# Patient Record
Sex: Female | Born: 1975 | Race: White | Hispanic: No | Marital: Married | Smoking: Former smoker
Health system: Southern US, Community
[De-identification: ages and names within clinical notes are randomized; demographics above are authoritative.]

## PROBLEM LIST (undated history)

## (undated) HISTORY — PX: SHOULDER SURGERY: SHX246

## (undated) HISTORY — PX: BREAST SURGERY: SHX581

---

## 2000-02-17 ENCOUNTER — Other Ambulatory Visit: Admission: RE | Admit: 2000-02-17 | Discharge: 2000-02-17 | Payer: Self-pay | Admitting: *Deleted

## 2001-02-18 ENCOUNTER — Other Ambulatory Visit: Admission: RE | Admit: 2001-02-18 | Discharge: 2001-02-18 | Payer: Self-pay | Admitting: *Deleted

## 2002-02-19 ENCOUNTER — Other Ambulatory Visit: Admission: RE | Admit: 2002-02-19 | Discharge: 2002-02-19 | Payer: Self-pay | Admitting: *Deleted

## 2003-02-24 ENCOUNTER — Other Ambulatory Visit: Admission: RE | Admit: 2003-02-24 | Discharge: 2003-02-24 | Payer: Self-pay | Admitting: *Deleted

## 2004-02-25 ENCOUNTER — Other Ambulatory Visit: Admission: RE | Admit: 2004-02-25 | Discharge: 2004-02-25 | Payer: Self-pay | Admitting: *Deleted

## 2005-02-28 ENCOUNTER — Other Ambulatory Visit: Admission: RE | Admit: 2005-02-28 | Discharge: 2005-02-28 | Payer: Self-pay | Admitting: *Deleted

## 2007-01-03 ENCOUNTER — Ambulatory Visit (HOSPITAL_COMMUNITY): Admission: RE | Admit: 2007-01-03 | Discharge: 2007-01-03 | Payer: Self-pay | Admitting: Obstetrics and Gynecology

## 2007-01-04 ENCOUNTER — Inpatient Hospital Stay (HOSPITAL_COMMUNITY): Admission: AD | Admit: 2007-01-04 | Discharge: 2007-01-07 | Payer: Self-pay | Admitting: Obstetrics and Gynecology

## 2007-01-05 ENCOUNTER — Encounter (INDEPENDENT_AMBULATORY_CARE_PROVIDER_SITE_OTHER): Payer: Self-pay | Admitting: Specialist

## 2007-05-14 ENCOUNTER — Other Ambulatory Visit: Admission: RE | Admit: 2007-05-14 | Discharge: 2007-05-14 | Payer: Self-pay | Admitting: Family Medicine

## 2007-12-05 ENCOUNTER — Other Ambulatory Visit: Admission: RE | Admit: 2007-12-05 | Discharge: 2007-12-05 | Payer: Self-pay | Admitting: Family Medicine

## 2008-04-23 IMAGING — US US FETAL BPP W/O NONSTRESS
1 series · 14 of 28 positions shown · non-contrast
Comparison: none

OBSTETRICAL ULTRASOUND:

 This ultrasound exam was performed in the [HOSPITAL] Ultrasound Department.  The OB US report was generated in the AS system, and faxed to the ordering physician.  This report is also available in [REDACTED] PACS.

[Series 1: us fetal bpp w/o nonstress · 0.37mm/px · 14 of 29 slices shown]
[im 2/29]
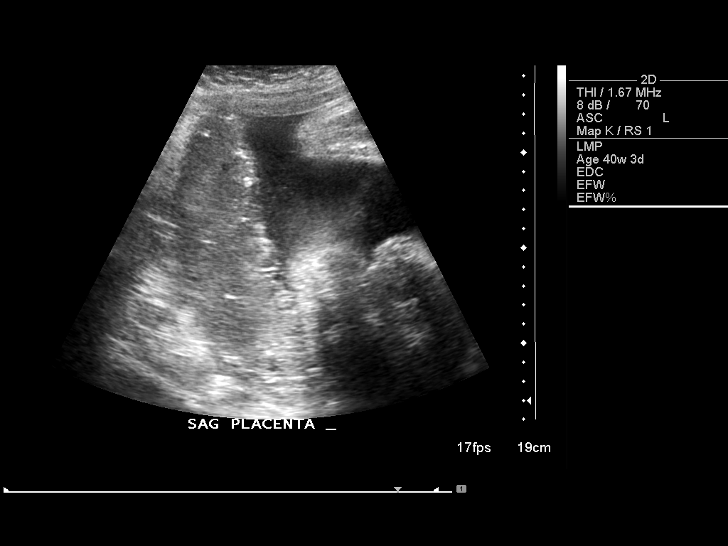
[im 4/29]
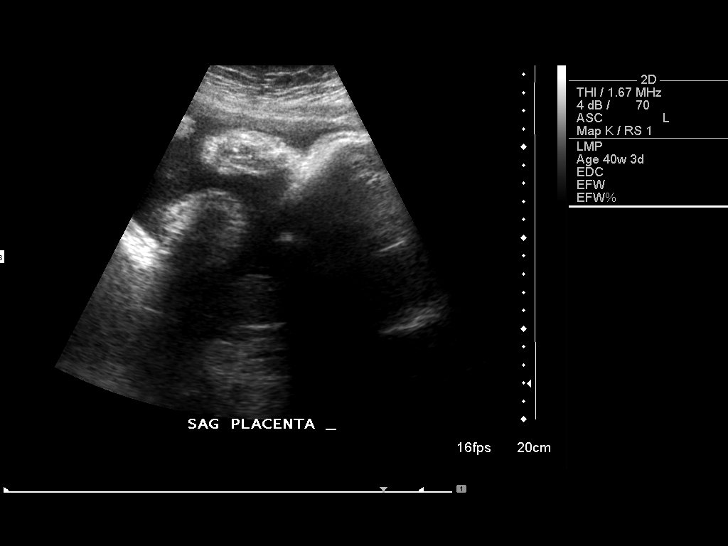
[im 6/29]
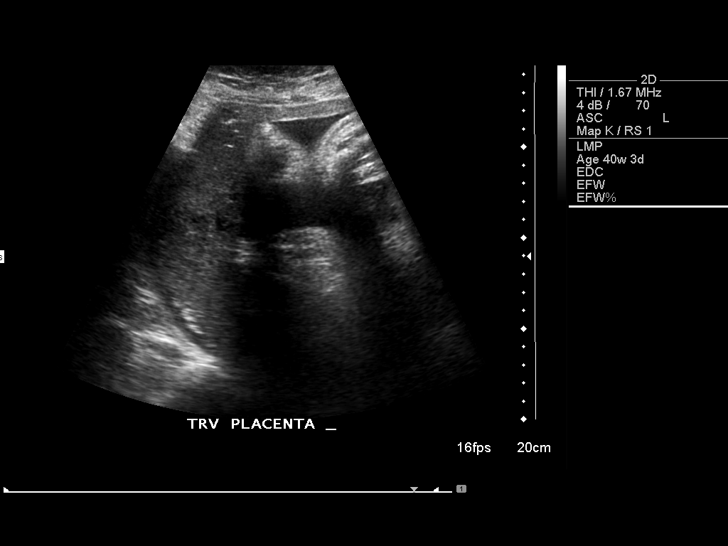
[im 8/29]
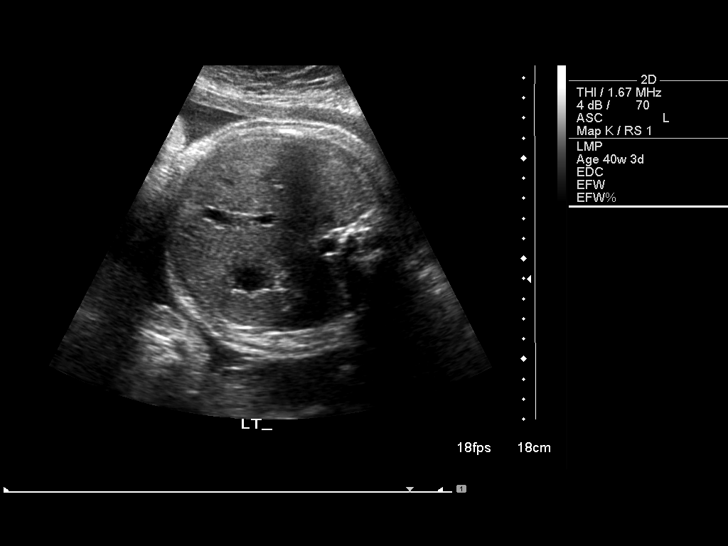
[im 10/29]
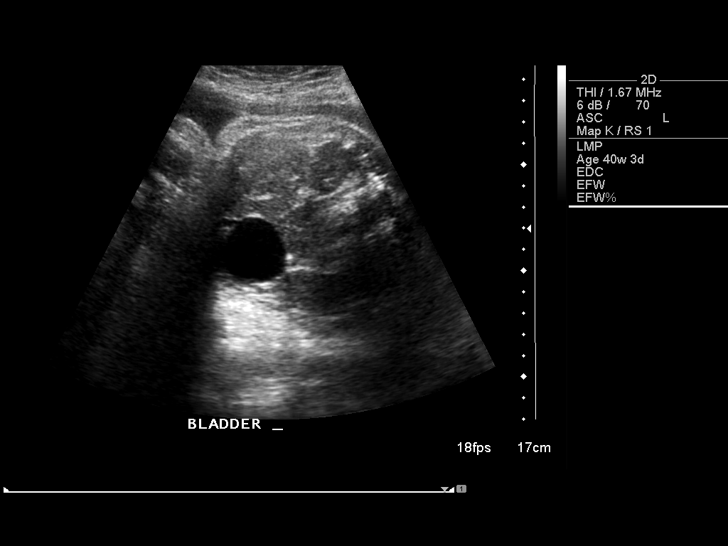
[im 12/29]
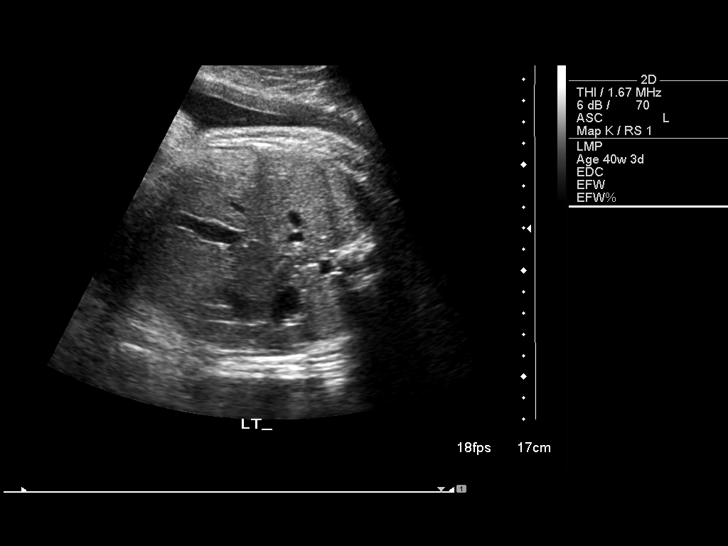
[im 14/29]
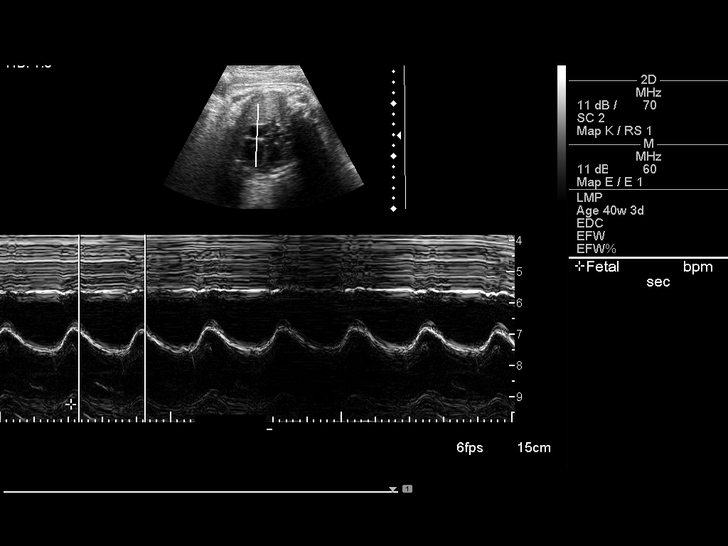
[im 16/29]
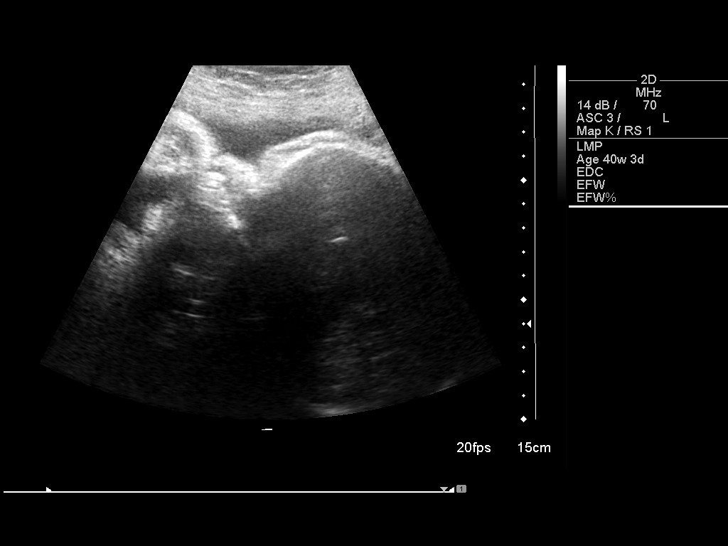
[im 18/29]
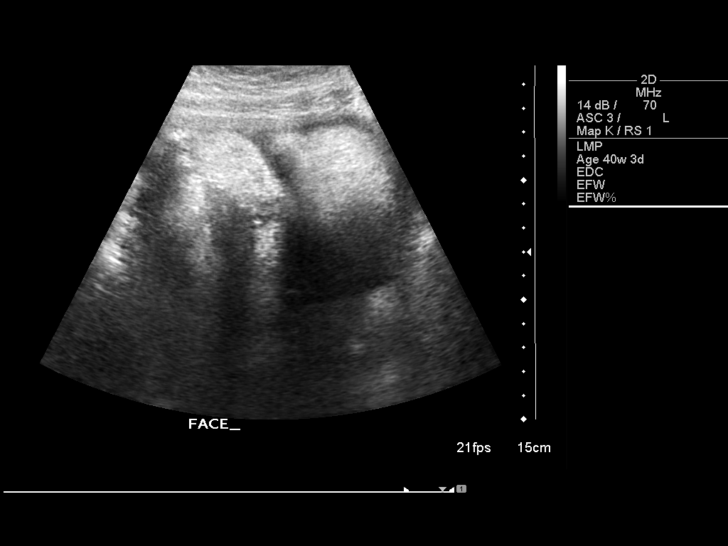
[im 20/29]
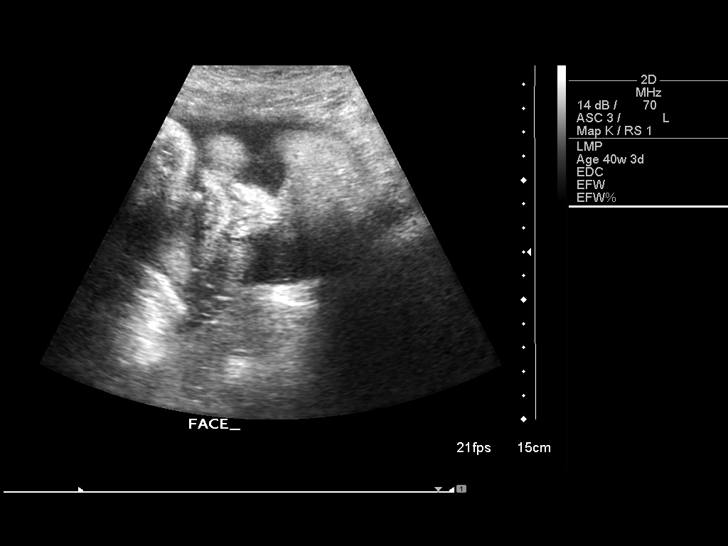
[im 22/29]
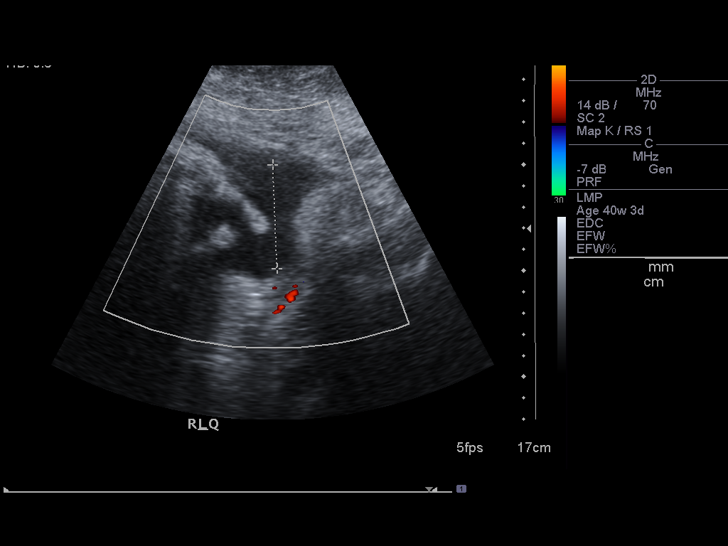
[im 24/29]
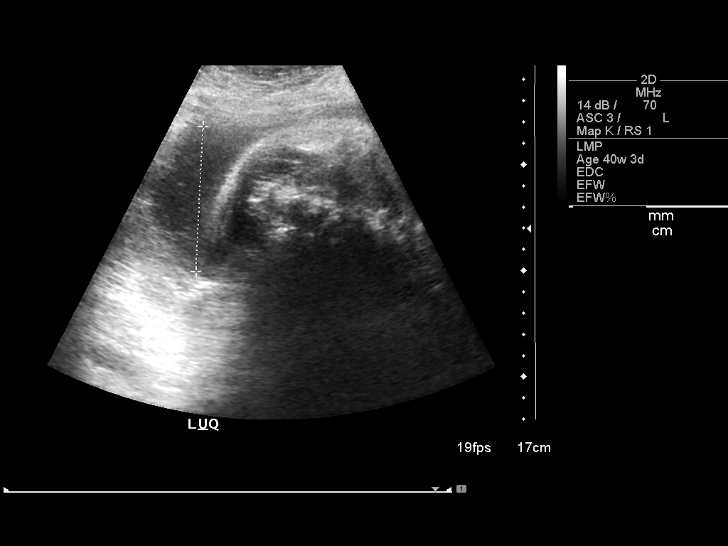
[im 26/29]
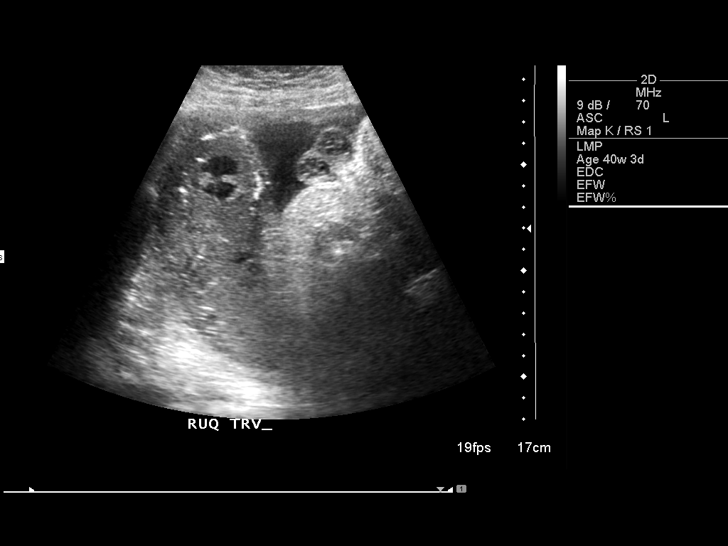
[im 29/29]
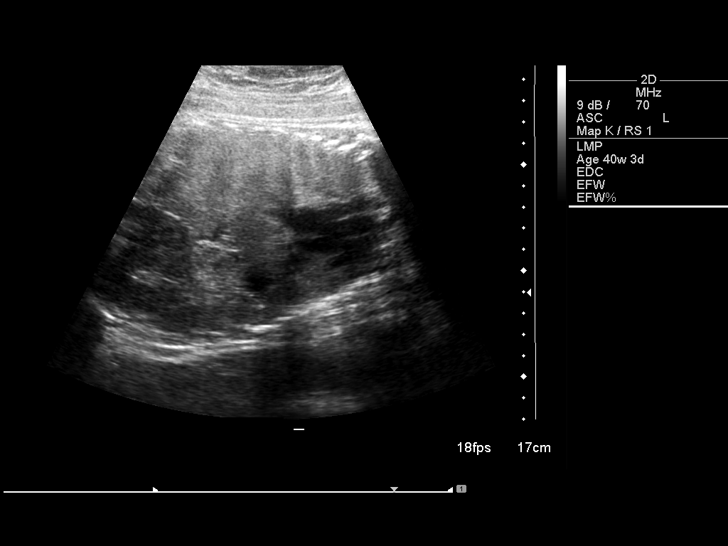

[14 of 28 positions shown; findings below may reference images not displayed]

IMPRESSION: See AS Obstetric US report.

## 2008-04-25 IMAGING — CR DG PORTABLE PELVIS
1 series · 1 of 1 positions shown · non-contrast
Comparison: No prior studies for comparison.

CLINICAL DATA: Status post vaginal delivery with repair.  Lost needle. 
 PORTABLE PELVIS:

[view not recorded]
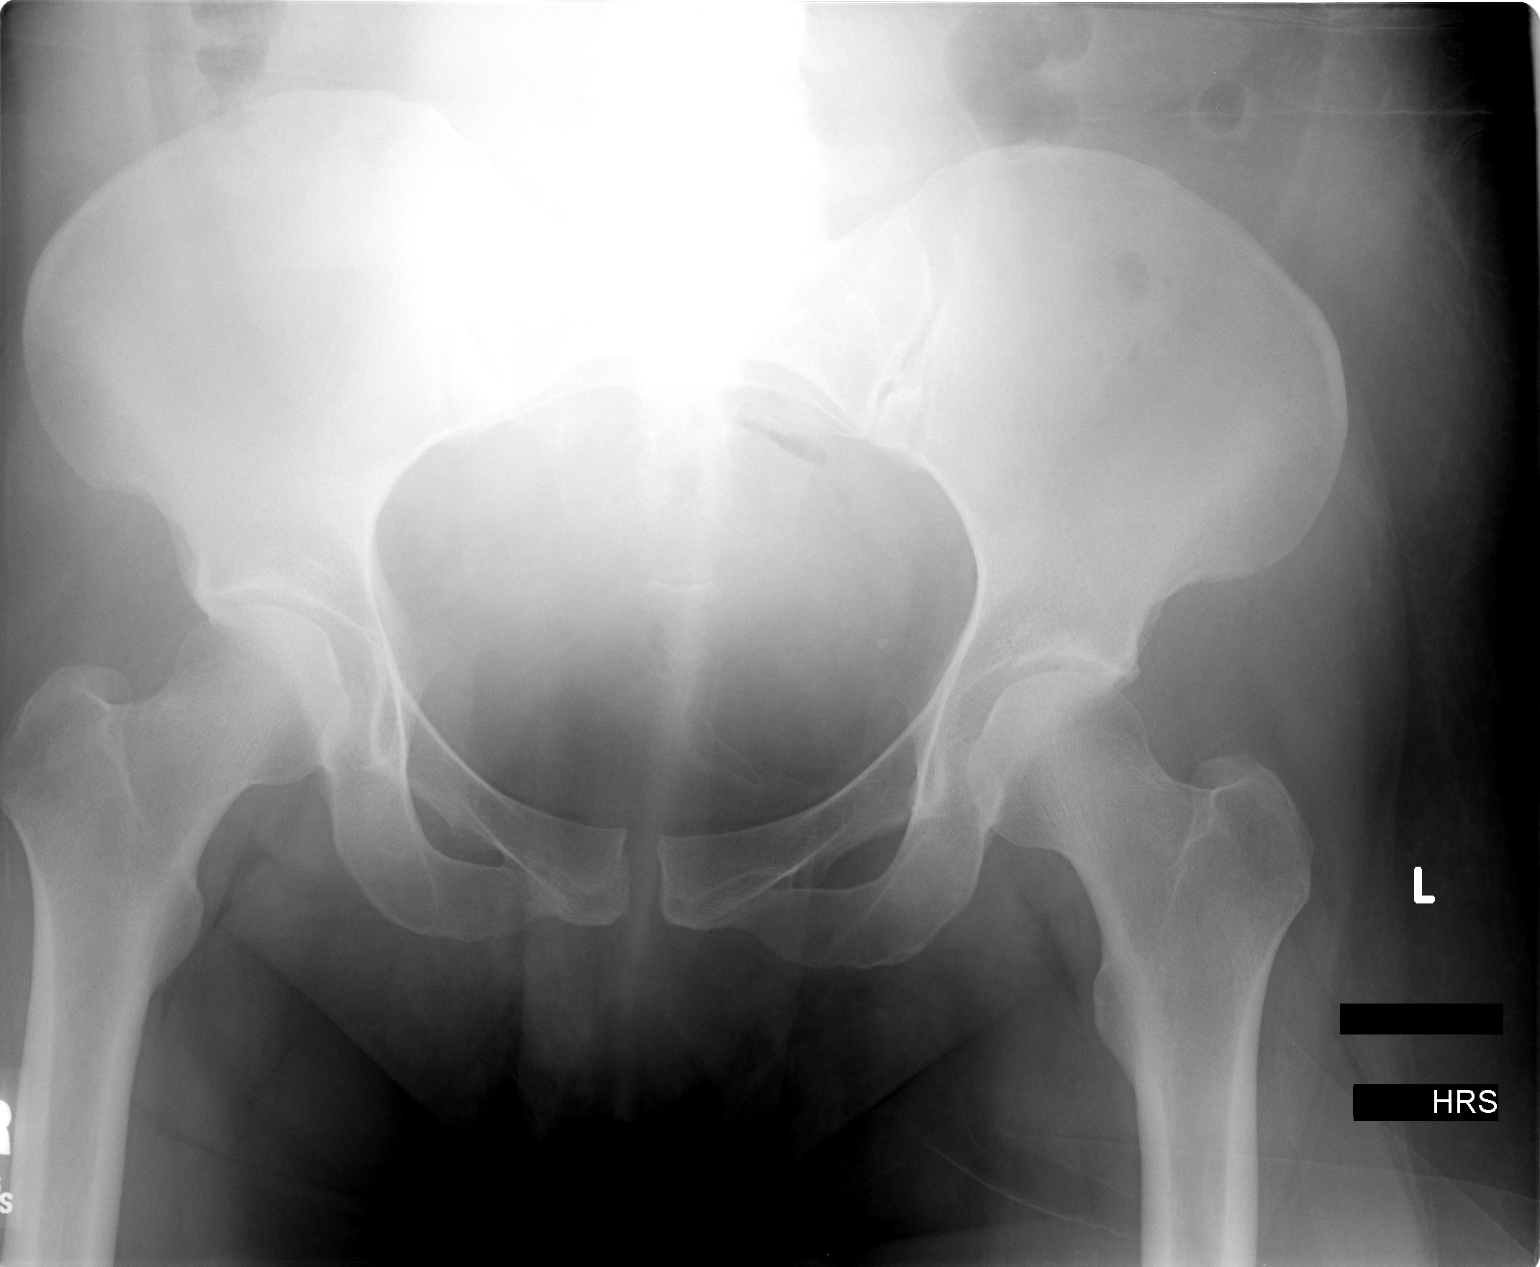

[1 of 1 positions shown; findings below may reference images not displayed]

FINDINGS: No visible foreign body. The bony pelvis is unremarkable.
IMPRESSION: No foreign body is seen.

## 2008-07-15 ENCOUNTER — Other Ambulatory Visit: Admission: RE | Admit: 2008-07-15 | Discharge: 2008-07-15 | Payer: Self-pay | Admitting: Family Medicine

## 2009-01-05 ENCOUNTER — Other Ambulatory Visit: Admission: RE | Admit: 2009-01-05 | Discharge: 2009-01-05 | Payer: Self-pay | Admitting: Family Medicine

## 2009-06-08 ENCOUNTER — Ambulatory Visit (HOSPITAL_COMMUNITY): Admission: RE | Admit: 2009-06-08 | Discharge: 2009-06-08 | Payer: Self-pay | Admitting: Obstetrics and Gynecology

## 2011-06-15 ENCOUNTER — Other Ambulatory Visit: Payer: Self-pay | Admitting: Plastic Surgery

## 2012-10-10 ENCOUNTER — Other Ambulatory Visit: Payer: Self-pay | Admitting: Obstetrics and Gynecology

## 2014-06-18 ENCOUNTER — Other Ambulatory Visit: Payer: Self-pay | Admitting: Obstetrics and Gynecology

## 2014-06-22 LAB — CYTOLOGY - PAP

## 2014-07-02 ENCOUNTER — Other Ambulatory Visit: Payer: Self-pay | Admitting: Obstetrics and Gynecology

## 2014-07-02 DIAGNOSIS — R928 Other abnormal and inconclusive findings on diagnostic imaging of breast: Secondary | ICD-10-CM

## 2014-07-10 ENCOUNTER — Ambulatory Visit
Admission: RE | Admit: 2014-07-10 | Discharge: 2014-07-10 | Disposition: A | Discharge status: Home / Self Care | Payer: 59 | Source: Ambulatory Visit | Attending: Obstetrics and Gynecology | Admitting: Obstetrics and Gynecology

## 2014-07-10 ENCOUNTER — Encounter (INDEPENDENT_AMBULATORY_CARE_PROVIDER_SITE_OTHER): Payer: Self-pay

## 2014-07-10 DIAGNOSIS — R928 Other abnormal and inconclusive findings on diagnostic imaging of breast: Secondary | ICD-10-CM

## 2015-07-02 ENCOUNTER — Other Ambulatory Visit: Payer: Self-pay | Admitting: Obstetrics and Gynecology

## 2015-07-05 LAB — CYTOLOGY - PAP

## 2016-03-14 ENCOUNTER — Other Ambulatory Visit: Payer: Self-pay | Admitting: Obstetrics and Gynecology

## 2016-03-14 DIAGNOSIS — N6489 Other specified disorders of breast: Secondary | ICD-10-CM

## 2016-03-17 ENCOUNTER — Ambulatory Visit
Admission: RE | Admit: 2016-03-17 | Discharge: 2016-03-17 | Disposition: A | Discharge status: Home / Self Care | Payer: Self-pay | Source: Ambulatory Visit | Attending: Obstetrics and Gynecology | Admitting: Obstetrics and Gynecology

## 2016-03-17 ENCOUNTER — Other Ambulatory Visit: Payer: Self-pay | Admitting: Obstetrics and Gynecology

## 2016-03-17 DIAGNOSIS — N6489 Other specified disorders of breast: Secondary | ICD-10-CM

## 2017-04-23 DIAGNOSIS — Z0000 Encounter for general adult medical examination without abnormal findings: Secondary | ICD-10-CM | POA: Diagnosis not present

## 2017-07-10 DIAGNOSIS — S63613A Unspecified sprain of left middle finger, initial encounter: Secondary | ICD-10-CM | POA: Diagnosis not present

## 2017-07-10 DIAGNOSIS — M79645 Pain in left finger(s): Secondary | ICD-10-CM | POA: Diagnosis not present

## 2017-08-17 DIAGNOSIS — J209 Acute bronchitis, unspecified: Secondary | ICD-10-CM | POA: Diagnosis not present

## 2017-10-15 DIAGNOSIS — Z01419 Encounter for gynecological examination (general) (routine) without abnormal findings: Secondary | ICD-10-CM | POA: Diagnosis not present

## 2017-10-15 DIAGNOSIS — Z6838 Body mass index (BMI) 38.0-38.9, adult: Secondary | ICD-10-CM | POA: Diagnosis not present

## 2017-10-17 IMAGING — MG 2D DIGITAL DIAGNOSTIC BILATERAL MAMMOGRAM WITH CAD AND ADJUNCT T
8 of 12 series · 8 of 28 positions shown · non-contrast
Comparison: Prior exam dated 06/30/2014 and 07/10/2014.

CLINICAL DATA: 39-year-old female status post reduction mammoplasty
in 6856. Follow-up of probable benign findings in the right breast.

EXAM:
2D DIGITAL DIAGNOSTIC BILATERAL MAMMOGRAM WITH CAD AND ADJUNCT TOMO
ULTRASOUND LEFT BREAST

[L CC synth-2D]
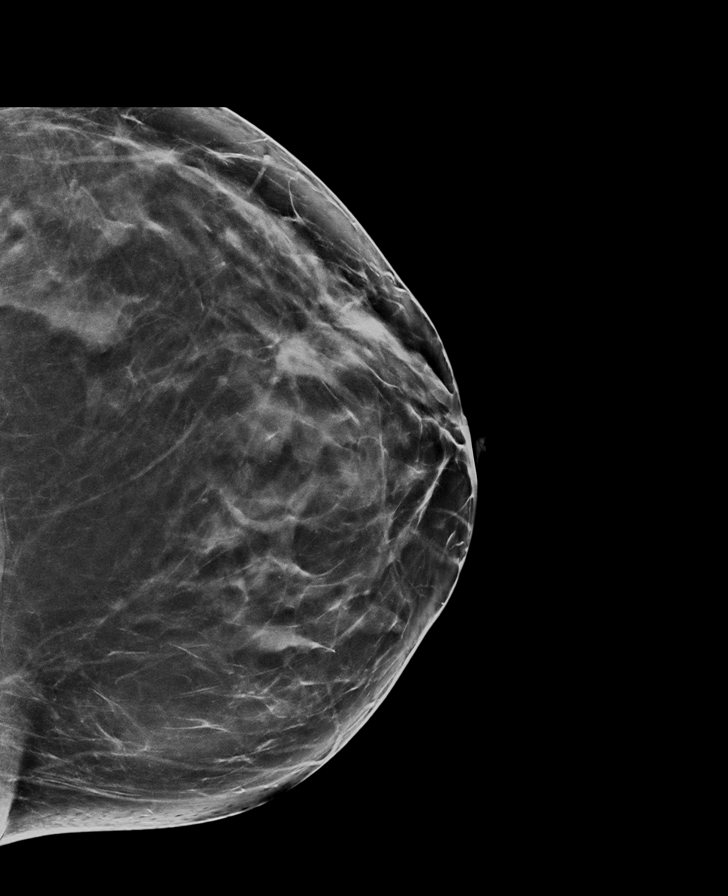

[L MLO synth-2D]
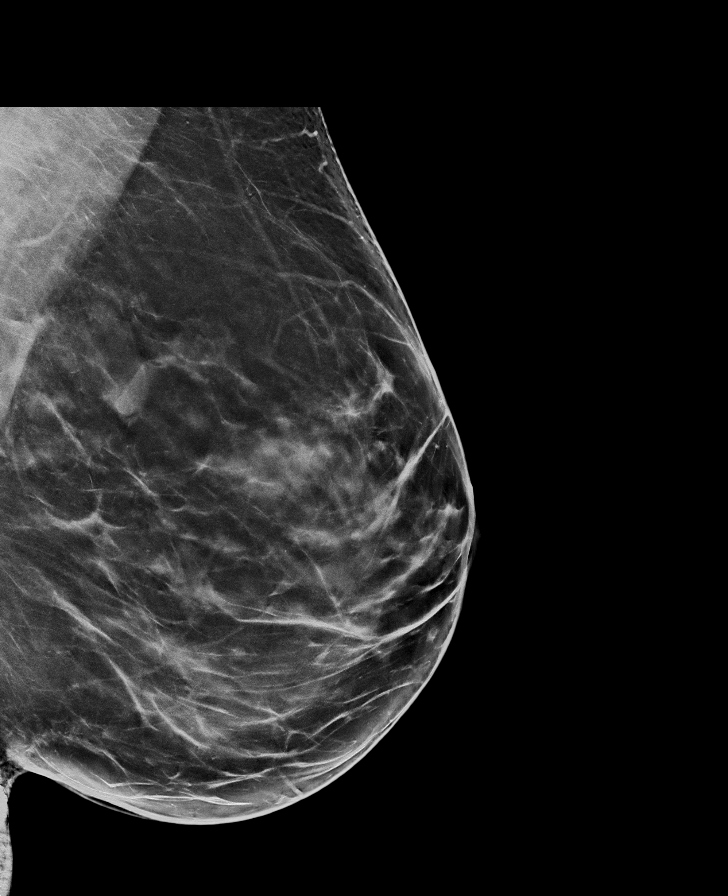

[R MLO synth-2D]
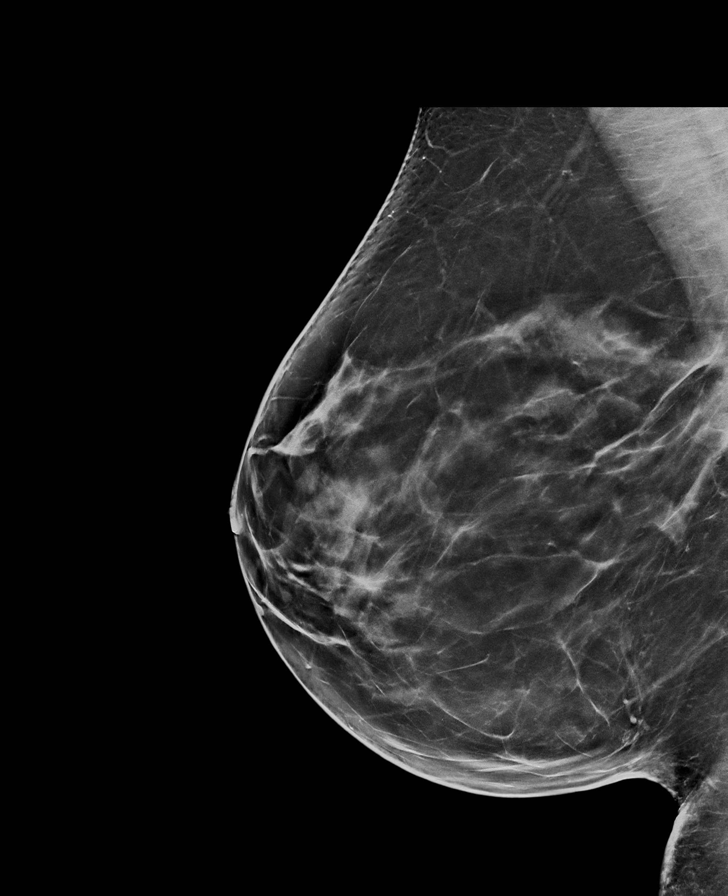

[R MLO]
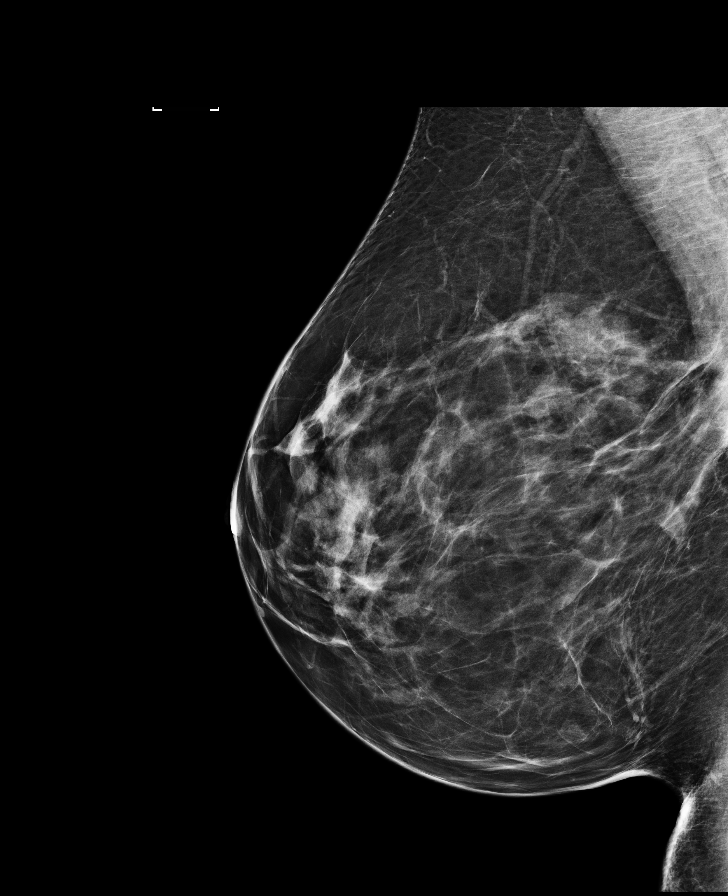

[L CC]
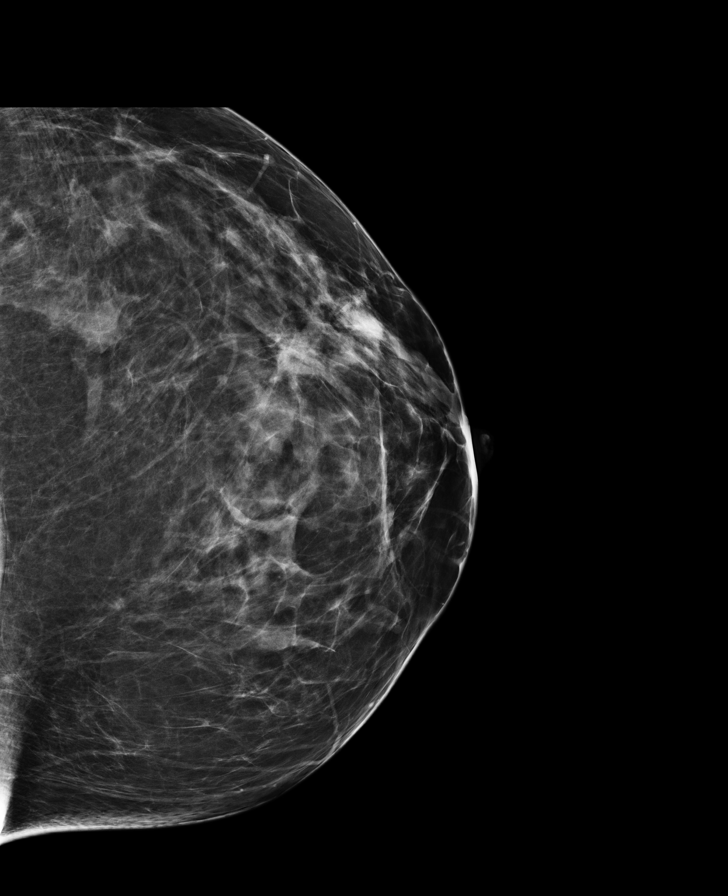

[R CC]
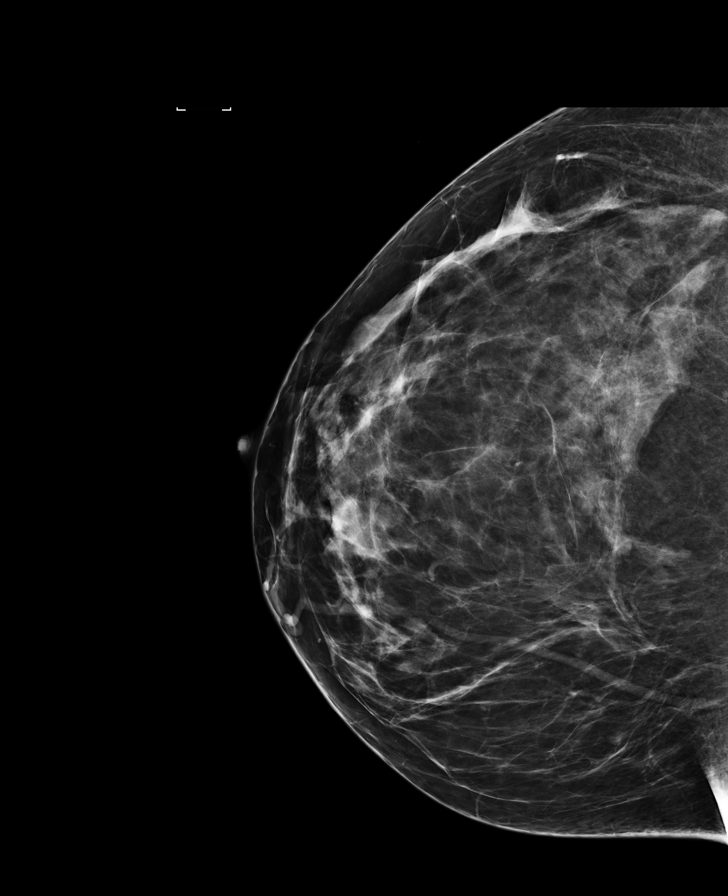

[L MLO]
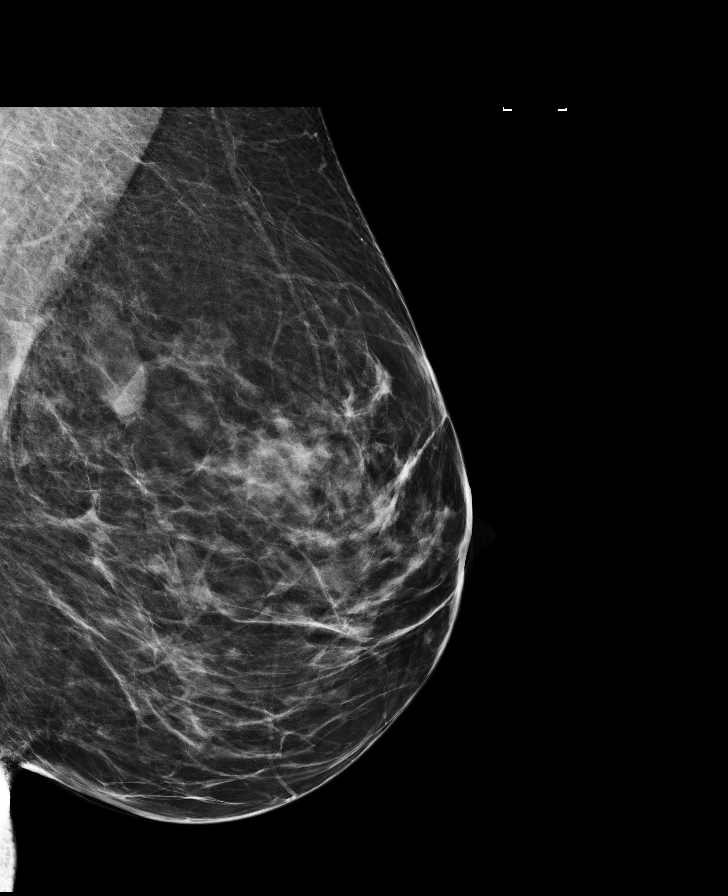

[R CC synth-2D]
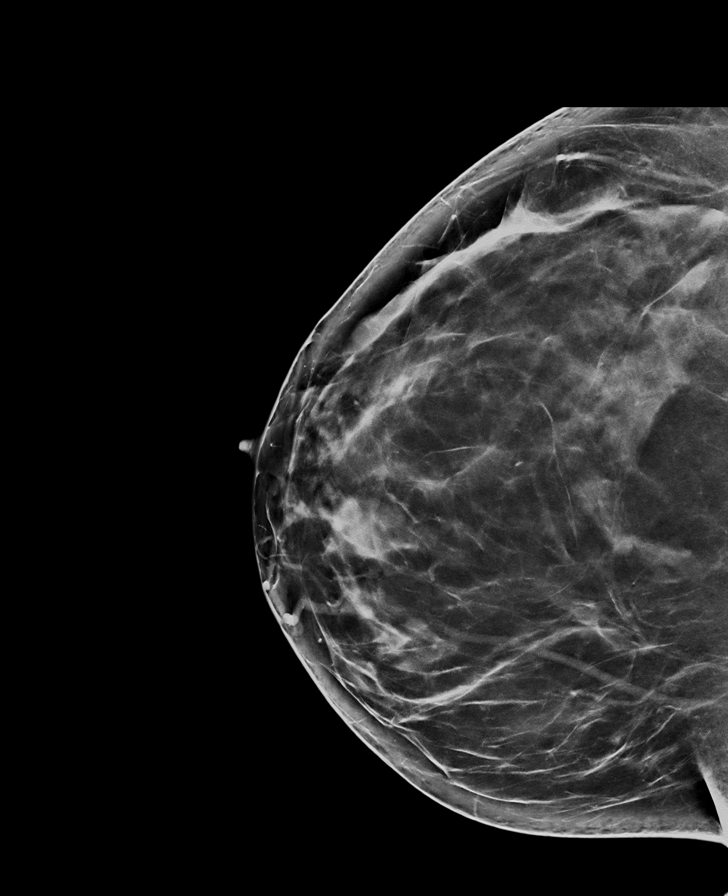

[8 of 28 positions shown; findings below may reference images not displayed]

ACR Breast Density Category b: There are scattered areas of
fibroglandular density.
FINDINGS: Changes of reduction mammoplasty are seen, bilaterally. Parenchymal
pattern of the right breast is stable. No suspicious mass or
malignant type microcalcifications identified in the right breast.
In the upper-outer quadrant of the left breast is an ovoid partially
obscured 1.2 cm mass. There are no malignant type
microcalcifications.

Mammographic images were processed with CAD.

On physical exam, I do not palpate a discrete mass in the
upper-outer quadrant of the left breast.

Targeted ultrasound is performed, showing a well-circumscribed
anechoic cyst in the left breast at 2 o'clock 6 cm from the nipple
measuring 1.2 x 0.6 x 1.5 cm. No solid mass or abnormal shadowing
detected.
IMPRESSION: Left breast cyst.  No evidence of malignancy in either breast.

RECOMMENDATION:
If the clinical exam remains benign/ stable screening mammography
can be deferred until the age of 42.

I have discussed the findings and recommendations with the patient.
Results were also provided in writing at the conclusion of the
visit. If applicable, a reminder letter will be sent to the patient
regarding the next appointment.

BI-RADS CATEGORY  2: Benign.

## 2017-11-05 DIAGNOSIS — Z1231 Encounter for screening mammogram for malignant neoplasm of breast: Secondary | ICD-10-CM | POA: Diagnosis not present

## 2017-12-13 DIAGNOSIS — R6883 Chills (without fever): Secondary | ICD-10-CM | POA: Diagnosis not present

## 2017-12-13 DIAGNOSIS — J029 Acute pharyngitis, unspecified: Secondary | ICD-10-CM | POA: Diagnosis not present

## 2018-05-01 DIAGNOSIS — Z0000 Encounter for general adult medical examination without abnormal findings: Secondary | ICD-10-CM | POA: Diagnosis not present

## 2018-05-02 DIAGNOSIS — S90121A Contusion of right lesser toe(s) without damage to nail, initial encounter: Secondary | ICD-10-CM | POA: Diagnosis not present

## 2018-05-02 DIAGNOSIS — M79671 Pain in right foot: Secondary | ICD-10-CM | POA: Diagnosis not present

## 2018-09-06 DIAGNOSIS — J069 Acute upper respiratory infection, unspecified: Secondary | ICD-10-CM | POA: Diagnosis not present

## 2018-09-06 DIAGNOSIS — L509 Urticaria, unspecified: Secondary | ICD-10-CM | POA: Diagnosis not present

## 2018-11-06 DIAGNOSIS — Z01419 Encounter for gynecological examination (general) (routine) without abnormal findings: Secondary | ICD-10-CM | POA: Diagnosis not present

## 2018-11-06 DIAGNOSIS — Z6838 Body mass index (BMI) 38.0-38.9, adult: Secondary | ICD-10-CM | POA: Diagnosis not present

## 2019-12-26 ENCOUNTER — Ambulatory Visit: Payer: 59 | Attending: Internal Medicine

## 2019-12-26 DIAGNOSIS — Z23 Encounter for immunization: Secondary | ICD-10-CM

## 2019-12-26 NOTE — Progress Notes (Signed)
   Covid-19 Vaccination Clinic  Name:  Christie Patel    MRN: [id number] DOB: 05/31/1976  12/26/2019  [name] was observed post Covid-19 immunization for 15 minutes without incident. She was provided with Vaccine Information Sheet and instruction to access the V-Safe system.   [name] was instructed to call 911 with any severe reactions post vaccine: Marland Kitchen Difficulty breathing  . Swelling of face and throat  . A fast heartbeat  . A bad rash all over body  . Dizziness and weakness   Immunizations Administered    Name Date Dose VIS Date Route   Pfizer COVID-19 Vaccine 12/26/2019 11:58 AM 0.3 mL 09/12/2019 Intramuscular   Manufacturer: [employer], [employer]   Lot: [id number]   NDC: [address]-6

## 2020-01-19 ENCOUNTER — Ambulatory Visit: Payer: 59 | Attending: Internal Medicine

## 2020-01-19 DIAGNOSIS — Z23 Encounter for immunization: Secondary | ICD-10-CM

## 2020-01-19 NOTE — Progress Notes (Signed)
   Covid-19 Vaccination Clinic  Name:  Christie Patel    MRN: [id number] DOB: [birth year]  01/19/2020  [name] was observed post Covid-19 immunization for 15 minutes without incident. She was provided with Vaccine Information Sheet and instruction to access the V-Safe system.   [name] was instructed to call 911 with any severe reactions post vaccine: Marland Kitchen Difficulty breathing  . Swelling of face and throat  . A fast heartbeat  . A bad rash all over body  . Dizziness and weakness   Immunizations Administered    Name Date Dose VIS Date Route   Pfizer COVID-19 Vaccine 01/19/2020  4:24 PM 0.3 mL 11/26/2018 Intramuscular   Manufacturer: [employer], [employer]   Lot: [id number]   NDC: [address]-5

## 2022-05-30 NOTE — Therapy (Deleted)
  OUTPATIENT PHYSICAL THERAPY TREATMENT NOTE   Patient Name: Christie Patel MRN: [id number] DOB:1976/08/30, 46 y.o., female Today's Date: 05/30/2022  PCP: None REFERRING PROVIDER: Jarrett Soho, PA-C  END OF SESSION:    No past medical history on file. *** The histories are not reviewed yet. Please review them in the "History" navigator section and refresh this SmartLink. There are no problems to display for this patient.   REFERRING DIAG: N39.3 (ICD-10-CM) - Stress incontinence (female) (female)  THERAPY DIAG:  No diagnosis found.  Rationale for Evaluation and Treatment Rehabilitation  PERTINENT HISTORY: ***  PRECAUTIONS: ***  SUBJECTIVE: ***  PAIN:  Are you having pain? {OPRCPAIN:27236}   OBJECTIVE: (objective measures completed at initial evaluation unless otherwise dated)   (Copy Eval's Objective through Plan section here)   Kenson Groh, PT 05/30/2022, 8:41 AM

## 2022-05-30 NOTE — Therapy (Unsigned)
OUTPATIENT PHYSICAL THERAPY FEMALE PELVIC EVALUATION   Patient Name: Christie Patel MRN: [id number] DOB:05/17/1976, 46 y.o., female Today's Date: 05/31/2022   PT End of Session - 05/31/22 1052     Visit Number 1    Date for PT Re-Evaluation 08/23/22    Authorization Type UHC    PT Start Time 1055    PT Stop Time 1135    PT Time Calculation (min) 40 min    Activity Tolerance Patient tolerated treatment well    Behavior During Therapy [facility] for tasks assessed/performed             History reviewed. No pertinent past medical history. Past Surgical History:  Procedure Laterality Date   BREAST SURGERY     SHOULDER SURGERY Bilateral    rotator cuff and biceps   There are no problems to display for this patient.   PCP: None  REFERRING PROVIDER: Jarrett Soho, PA-C  REFERRING DIAG: N39.3 (ICD-10-CM) - Stress incontinence (female) (female)  THERAPY DIAG:  No diagnosis found.  Rationale for Evaluation and Treatment Rehabilitation  ONSET DATE: 2008  SUBJECTIVE:                                                                                                                                                                                           SUBJECTIVE STATEMENT: Patient started with stress incontinence after having child in 2008. Phobia with public bathroom.  Fluid intake: Yes: water     PAIN:  Are you having pain? yes NPRS scale: 4/10 Pain location: Vaginal  Pain type: discomfort Pain description: intermittent   Aggravating factors: during intercourse Relieving factors: no vaginal penetration  PRECAUTIONS: None  WEIGHT BEARING RESTRICTIONS No  FALLS:  Has patient fallen in last 6 months? No  LIVING ENVIRONMENT: Lives with: lives with their family  OCCUPATION: sitting job; walks 2 miles per day, yoga, [employer]  PLOF: Independent  PATIENT GOALS reduce leakage  PERTINENT HISTORY:  none  BOWEL MOVEMENT Pain with bowel movement:  No  URINATION Pain with urination: No Fully empty bladder: Yes: can urinate in one hour without having liquid prior Stream:  average Urgency: Yes: sometimes Frequency: in the morning will go more often but later in the day can wait longer.  Leakage: Urge to void, Walking to the bathroom, Coughing, Sneezing, and Exercise Pads: No  INTERCOURSE Pain with intercourse: During Penetration Ability to have vaginal penetration:  Yes:   Climax: yes Marinoff Scale: 1/3  PREGNANCY Vaginal deliveries 1 Tearing Yes: tear  PROLAPSE None    OBJECTIVE:   COGNITION:  Overall cognitive status: Within functional limits for tasks assessed  SENSATION:  Light touch: Appears intact  Proprioception: Appears intact                 POSTURE: No Significant postural limitations   PELVIC ALIGNMENT:  LUMBARAROM/PROM lumbar ROM is full   LOWER EXTREMITY ROM: bilateral hip ROM is full   LOWER EXTREMITY MMT:  MMT Right eval Left eval  Hip abduction 4/5 4/5    PALPATION:   General  contract abdomen and will slightly bulge the lower abdomen.                 External Perineal Exam intact                             Internal Pelvic Floor sides of introitus is tight. After manual work to the sides of the introitus the patient was able to do a circular contraction with a hug of the therapist finger holding 3-4 seconds. She has to contract slowly to form the lift of the pelvic floor.   Patient confirms identification and approves PT to assess internal pelvic floor and treatment Yes  PELVIC MMT:   MMT eval  Vaginal 2/5 with oval contraction  (Blank rows = not tested)        TONE: good  PROLAPSE: None  TODAY'S TREATMENT  EVAL Date: 05/31/2022 HEP established-see below    PATIENT EDUCATION:  05/31/2022 Education details: Access Code: [email or web address] Person educated: Patient Education method: Explanation, Demonstration, Tactile cues, Verbal cues, and Handouts Education  comprehension: verbalized understanding, returned demonstration, verbal cues required, tactile cues required, and needs further education   HOME EXERCISE PROGRAM: 05/31/2022 Access Code: [email or web address] URL: [email or web address] Date: 05/31/2022 Prepared by: Eulis Foster  Exercises - Seated Pelvic Floor Contraction  - 4 x daily - 7 x weekly - 1 sets - 5 reps - 4-5 sec hold  ASSESSMENT:  CLINICAL IMPRESSION: Patient is a 46 y.o. female who was seen today for physical therapy evaluation and treatment for stress incontinence. Patient reports her incontinence started 15 years ago when she had her son vaginally. She will leak urine with urge to void, walking to the bathroom, coughing, sneezing, and exercise.Patient will urinate more often  in the morning  but later in the day can wait longer.Pelvic floor strength was 2/5 in oval shape. The therapist performed manual work to the sides of the introitus and she was able to perform a circular hug with lift and strength went to 3/5. She is only able to hold for 3-4 seconds and needs to contract slowly to have the circular hug. Patient will bear down slightly with a cough or laugh. She does not fully recruit her lower abdominals with contraction. Patient has some pain with vaginal penetration but less with lubricant. There were no tenderness in the pelvic floor muscles. Patient will benefit from skilled therapy to improve pelvic floor strengthening and coordination to reduce her leakage.    OBJECTIVE IMPAIRMENTS decreased coordination, decreased endurance, decreased strength, and increased fascial restrictions.   ACTIVITY LIMITATIONS continence  PARTICIPATION LIMITATIONS:  exercise  PERSONAL FACTORS  none  are also affecting patient's functional outcome.   REHAB POTENTIAL: Excellent  CLINICAL DECISION MAKING: Stable/uncomplicated  EVALUATION COMPLEXITY: Low   GOALS: Goals reviewed with patient? Yes  SHORT TERM GOALS: Target date:  06/28/2022  Patient independent with initial HEP for pelvic floor strengthening to reduce leakage.  Baseline: Goal status: INITIAL   LONG TERM GOALS: Target date: 08/23/2022   Patient  independent with advanced HEP for core and pelvic floor strength to reduce leakage.  Baseline:  Goal status: INITIAL  2.  Patient is able to recruit her lower abdominals with exercise to reduce the pressure on the pelvic floor causing leakage.  Baseline:  Goal status: INITIAL  3.  Patient is able to cough, laugh, and sneeze without leakage due to the ability to contract and lift the pelvic floor.  Baseline:  Goal status: INITIAL  4.  Patient is able to walk to the bathroom without urinary leakage due to improved coordination.  Baseline:  Goal status: INITIAL   PLAN: PT FREQUENCY: 1x/week  PT DURATION: 12 weeks  PLANNED INTERVENTIONS: Therapeutic exercises, Therapeutic activity, Neuromuscular re-education, Patient/Family education, Self Care, Joint mobilization, Biofeedback, and Manual therapy  PLAN FOR NEXT SESSION: work on abdominal recruitment, pelvic floor strength, quick flicks, lubricant education   Eulis Foster, PT 05/31/22 11:59 AM

## 2022-05-31 ENCOUNTER — Encounter: Payer: Self-pay | Admitting: Physical Therapy

## 2022-05-31 ENCOUNTER — Ambulatory Visit: Payer: 59 | Attending: Family Medicine | Admitting: Physical Therapy

## 2022-05-31 ENCOUNTER — Other Ambulatory Visit: Payer: Self-pay

## 2022-05-31 DIAGNOSIS — R278 Other lack of coordination: Secondary | ICD-10-CM | POA: Diagnosis present

## 2022-05-31 DIAGNOSIS — M6281 Muscle weakness (generalized): Secondary | ICD-10-CM | POA: Diagnosis present

## 2022-06-21 ENCOUNTER — Encounter: Payer: Self-pay | Admitting: Physical Therapy

## 2022-06-21 ENCOUNTER — Ambulatory Visit: Payer: 59 | Attending: Family Medicine | Admitting: Physical Therapy

## 2022-06-21 DIAGNOSIS — R278 Other lack of coordination: Secondary | ICD-10-CM

## 2022-06-21 DIAGNOSIS — M6281 Muscle weakness (generalized): Secondary | ICD-10-CM | POA: Diagnosis present

## 2022-06-21 NOTE — Patient Instructions (Signed)
  Lubrication Used for intercourse to reduce friction Avoid ones that have glycerin, nonoxynol-9, petroleum, propylene glycol, chlorhexidine gluconate, warming gels, tingling gels, icing or cooling gel, scented Avoid parabens due to a preservative similar to female sex hormone May need to be reapplied once or several times during sexual activity Can be applied to both partners genitals prior to vaginal penetration to minimize friction or irritation Prevent irritation and mucosal tears that cause post coital pain and increased the risk of vaginal and urinary tract infections Oil-based lubricants cannot be used with condoms due to breaking them down.  Least likely to irritate vaginal tissue.  Plant based-lubes are safe Silicone-based lubrication are thicker and last long and used for post-menopausal women Water based lubricant good for toys  Vaginal Lubricators Here is a list of some suggested lubricators you can use for intercourse. Use the most hypoallergenic product.  You can place on you or your partner.  Slippery Stuff ( water based) Sylk or Sliquid Natural H2O ( good  if frequent UTI's)- [employer], amazon Sliquid organics silk-(aloe and silicone based ) [employer] (www.blossom-organics.com)- (aloe based ) Coconut oil, olive oil -not good with condoms  PJur Woman Nude- (water based) amazon Uberlube- ( silicon) [place] has an organic one Yes lubricant- (water based and has plant oil based similar to silicone) [employer] Platinum-Silicone, [employer], [facility] Olive and Bee intimate cream-  www.oliveandbee.com.au Pink - [employer] Erosense Sync- walmart, amazon Coconu- coconu.com [place] [employer] to avoid in lubricants are glycerin, warming gels, tingling gels, icing or cooling  gels, and scented gels.  Also avoid Vaseline. KY jelly,  and Astroglide contain chlorhexidine which kills good bacteria(lactobacilli)  Things to avoid in the vaginal area Do  not use things to irritate the vulvar area No lotions- see below Soaps you  can use :Aveeno, [place], Good Clean Love cleanser if needed. Must be gentle No deodorants No douches Good to sleep without underwear to let the vaginal area to air out No scrubbing: spread the lips to let warm water rinse over labias and pat dry  Creams that can be used on the Vulva Area V [employer], [place] Pro-Meno Wild Yam Cream Coconut oil, olive oil Cleo by [employer] labial moisturizer -[employer],  [place] [employer] Releveum ( lidocaine) or [employer] Yes [facility] [address] [place], [place] [address] Phone # [phone] Fax [phone]

## 2022-06-21 NOTE — Therapy (Signed)
OUTPATIENT PHYSICAL THERAPY TREATMENT NOTE   Patient Name: Christie Patel MRN: [id number] DOB:April 06, 1976, 46 y.o., female Today's Date: 06/21/2022  PCP: None REFERRING PROVIDER: Jarrett Soho, PA-C  END OF SESSION:   PT End of Session - 06/21/22 1142     Visit Number 2    Date for PT Re-Evaluation 08/23/22    Authorization Type UHC    PT Start Time 1100    PT Stop Time 1138    PT Time Calculation (min) 38 min    Activity Tolerance Patient tolerated treatment well    Behavior During Therapy [facility] for tasks assessed/performed             History reviewed. No pertinent past medical history. Past Surgical History:  Procedure Laterality Date   BREAST SURGERY     SHOULDER SURGERY Bilateral    rotator cuff and biceps   There are no problems to display for this patient. REFERRING DIAG: N39.3 (ICD-10-CM) - Stress incontinence (female) (female)   THERAPY DIAG:  No diagnosis found.   Rationale for Evaluation and Treatment Rehabilitation   ONSET DATE: 2008   SUBJECTIVE:                                                                                                                                                                                            SUBJECTIVE STATEMENT: I doing the same.     PAIN:  Are you having pain? yes NPRS scale: 4/10 Pain location: Vaginal   Pain type: discomfort Pain description: intermittent    Aggravating factors: during intercourse Relieving factors: no vaginal penetration   PRECAUTIONS: None   WEIGHT BEARING RESTRICTIONS No   FALLS:  Has patient fallen in last 6 months? No   LIVING ENVIRONMENT: Lives with: lives with their family   OCCUPATION: sitting job; walks 2 miles per day, yoga, [employer]   PLOF: Independent   PATIENT GOALS reduce leakage   PERTINENT HISTORY:  none   BOWEL MOVEMENT Pain with bowel movement: No   URINATION Pain with urination: No Fully empty bladder: Yes: can urinate in one hour without  having liquid prior Stream:  average Urgency: Yes: sometimes Frequency: in the morning will go more often but later in the day can wait longer.  Leakage: Urge to void, Walking to the bathroom, Coughing, Sneezing, and Exercise Pads: No   INTERCOURSE Pain with intercourse: During Penetration Ability to have vaginal penetration:  Yes:   Climax: yes Marinoff Scale: 1/3   PREGNANCY Vaginal deliveries 1 Tearing Yes: tear   PROLAPSE None       OBJECTIVE:    COGNITION:  Overall cognitive status: Within functional limits for tasks assessed                          SENSATION:            Light touch: Appears intact            Proprioception: Appears intact                    POSTURE: No Significant postural limitations               PELVIC ALIGNMENT:   LUMBARAROM/PROM lumbar ROM is full     LOWER EXTREMITY ROM: bilateral hip ROM is full     LOWER EXTREMITY MMT:   MMT Right eval Left eval  Hip abduction 4/5 4/5     PALPATION:   General  contract abdomen and will slightly bulge the lower abdomen.                  External Perineal Exam intact                             Internal Pelvic Floor sides of introitus is tight. After manual work to the sides of the introitus the patient was able to do a circular contraction with a hug of the therapist finger holding 3-4 seconds. She has to contract slowly to form the lift of the pelvic floor.    Patient confirms identification and approves PT to assess internal pelvic floor and treatment Yes   PELVIC MMT:   MMT eval  Vaginal 2/5 with oval contraction  (Blank rows = not tested)         TONE: good   PROLAPSE: None   TODAY'S TREATMENT  06/21/2022 Neuromuscular re-education: Pelvic floor contraction training:supine marching holding yoga block at shoulder height and pelvic floor contraction.  Supine bring hand to opposite knee with pelvic floor contraction Plank on knees with engagement of pelvic floor and  abdominals Side plank with engagement of abdominals and pelvic floor.  Bridge with core activation and pelvic floor then contract one gluteal at a time   Self-care: Educated patient on lubricants and vaginal health to reduce pain with intercourse.        PATIENT EDUCATION:  06/21/2022 Education details: Access Code: [email or web address], educated patient on vaginal lubricants and vaginal health Person educated: Patient Education method: Explanation, Demonstration, Tactile cues, Verbal cues, and Handouts Education comprehension: verbalized understanding, returned demonstration, verbal cues required, tactile cues required, and needs further education     HOME EXERCISE PROGRAM: 06/21/2022 Access Code: [email or web address] URL: [email or web address] Date: 06/21/2022 Prepared by: Eulis Foster  Exercises - Seated Pelvic Floor Contraction  - 4 x daily - 7 x weekly - 1 sets - 5 reps - 4-5 sec hold - Supine March  - 1 x daily - 7 x weekly - 2 sets - 10 reps - Supine 90/90 Abdominal Bracing  - 1 x daily - 7 x weekly - 1 sets - 10 reps - Plank on Knees  - 1 x daily - 7 x weekly - 3 sets - 10 reps - Side Plank on Knees  - 1 x daily - 7 x weekly - 3 sets - 10 reps - Supine Bridge  - 1 x daily - 7 x weekly - 3 sets - 10 reps  ASSESSMENT:   CLINICAL IMPRESSION: Patient is a 46 y.o. female who was  seen today for physical therapy treatment for stress incontinence. Patient understands different lubricants she can use and which ones are good for different activities. Patient learned how to engage her abdominal and pelvic floor with her exercises she does at [place] to reduce strain on pelvic floor and urinary leakage. . Patient will benefit from skilled therapy to improve pelvic floor strengthening and coordination to reduce her leakage.      OBJECTIVE IMPAIRMENTS decreased coordination, decreased endurance, decreased strength, and increased fascial restrictions.    ACTIVITY LIMITATIONS continence    PARTICIPATION LIMITATIONS:  exercise   PERSONAL FACTORS  none  are also affecting patient's functional outcome.    REHAB POTENTIAL: Excellent   CLINICAL DECISION MAKING: Stable/uncomplicated   EVALUATION COMPLEXITY: Low     GOALS: Goals reviewed with patient? Yes   SHORT TERM GOALS: Target date: 06/28/2022   Patient independent with initial HEP for pelvic floor strengthening to reduce leakage.  Baseline: Goal status: INITIAL     LONG TERM GOALS: Target date: 08/23/2022    Patient independent with advanced HEP for core and pelvic floor strength to reduce leakage.  Baseline:  Goal status: INITIAL   2.  Patient is able to recruit her lower abdominals with exercise to reduce the pressure on the pelvic floor causing leakage.  Baseline:  Goal status: INITIAL   3.  Patient is able to cough, laugh, and sneeze without leakage due to the ability to contract and lift the pelvic floor.  Baseline:  Goal status: INITIAL   4.  Patient is able to walk to the bathroom without urinary leakage due to improved coordination.  Baseline:  Goal status: INITIAL     PLAN: PT FREQUENCY: 1x/week   PT DURATION: 12 weeks   PLANNED INTERVENTIONS: Therapeutic exercises, Therapeutic activity, Neuromuscular re-education, Patient/Family education, Self Care, Joint mobilization, Biofeedback, and Manual therapy   PLAN FOR NEXT SESSION:  pelvic floor strength with exercises, quick flicks, manual work to vagina to work on Product/process development scientist of pelvic floor, urge to void   Earlie Counts, PT 06/21/22 11:43 AM

## 2022-07-05 ENCOUNTER — Ambulatory Visit: Payer: 59 | Attending: Family Medicine | Admitting: Physical Therapy

## 2022-07-05 ENCOUNTER — Encounter: Payer: Self-pay | Admitting: Physical Therapy

## 2022-07-05 DIAGNOSIS — R278 Other lack of coordination: Secondary | ICD-10-CM | POA: Diagnosis present

## 2022-07-05 DIAGNOSIS — M6281 Muscle weakness (generalized): Secondary | ICD-10-CM | POA: Insufficient documentation

## 2022-07-05 NOTE — Therapy (Signed)
OUTPATIENT PHYSICAL THERAPY TREATMENT NOTE   Patient Name: Christie Patel MRN: [id number] DOB:[birth year], 46 y.o., female Today's Date: 07/05/2022  PCP: None REFERRING PROVIDER: Jarrett Soho, PA-C  END OF SESSION:   PT End of Session - 07/05/22 1018     Visit Number 3    Date for PT Re-Evaluation 08/23/22    Authorization Type [employer]    PT Start Time 1015    PT Stop Time 1055    PT Time Calculation (min) 40 min    Activity Tolerance Patient tolerated treatment well    Behavior During Therapy [facility] for tasks assessed/performed             History reviewed. No pertinent past medical history. Past Surgical History:  Procedure Laterality Date   BREAST SURGERY     SHOULDER SURGERY Bilateral    rotator cuff and biceps   There are no problems to display for this patient.  REFERRING DIAG: N39.3 (ICD-10-CM) - Stress incontinence (female) (female)   THERAPY DIAG:  No diagnosis found.   Rationale for Evaluation and Treatment Rehabilitation   ONSET DATE: 2008   SUBJECTIVE:                                                                                                                                                                                            SUBJECTIVE STATEMENT: I am working out differently in my barre class. I get the urge to void on the last part of my walk.    PAIN:  Are you having pain? yes NPRS scale: 4/10 Pain location: Vaginal   Pain type: discomfort Pain description: intermittent    Aggravating factors: during intercourse Relieving factors: no vaginal penetration   PRECAUTIONS: None   WEIGHT BEARING RESTRICTIONS No   FALLS:  Has patient fallen in last 6 months? No   LIVING ENVIRONMENT: Lives with: lives with their family   OCCUPATION: sitting job; walks 2 miles per day, yoga, [employer]   PLOF: Independent   PATIENT GOALS reduce leakage   PERTINENT HISTORY:  none   BOWEL MOVEMENT Pain with bowel movement: No   URINATION Pain  with urination: No Fully empty bladder: Yes: can urinate in one hour without having liquid prior Stream:  average Urgency: Yes: sometimes Frequency: in the morning will go more often but later in the day can wait longer.  Leakage: Urge to void, Walking to the bathroom, Coughing, Sneezing, and Exercise Pads: No   INTERCOURSE Pain with intercourse: During Penetration Ability to have vaginal penetration:  Yes:   Climax: yes Marinoff Scale: 1/3   PREGNANCY Vaginal deliveries 1 Tearing Yes: tear  PROLAPSE None       OBJECTIVE:    COGNITION:            Overall cognitive status: Within functional limits for tasks assessed                          SENSATION:            Light touch: Appears intact            Proprioception: Appears intact                    POSTURE: No Significant postural limitations               PELVIC ALIGNMENT:   LUMBARAROM/PROM lumbar ROM is full     LOWER EXTREMITY ROM: bilateral hip ROM is full     LOWER EXTREMITY MMT:   MMT Right eval Left eval  Hip abduction 4/5 4/5     PALPATION:   General  contract abdomen and will slightly bulge the lower abdomen.                  External Perineal Exam intact                             Internal Pelvic Floor sides of introitus is tight. After manual work to the sides of the introitus the patient was able to do a circular contraction with a hug of the therapist finger holding 3-4 seconds. She has to contract slowly to form the lift of the pelvic floor.    Patient confirms identification and approves PT to assess internal pelvic floor and treatment Yes   PELVIC MMT:   MMT eval 07/05/2022  Vaginal 2/5 with oval contraction 3/5  (Blank rows = not tested)         TONE: good   PROLAPSE: None   TODAY'S TREATMENT  07/05/2022 Manual: Internal pelvic floor techniques:No emotional/communication barriers or cognitive limitation. Patient is motivated to learn. Patient understands and agrees with  treatment goals and plan. PT explains patient will be examined in standing, sitting, and lying down to see how their muscles and joints work. When they are ready, they will be asked to remove their underwear so PT can examine their perineum. The patient is also given the option of providing their own chaperone as one is not provided in our facility. The patient also has the right and is explained the right to defer or refuse any part of the evaluation or treatment including the internal exam. With the patient's consent, PT will use one gloved finger to gently assess the muscles of the pelvic floor, seeing how well it contracts and relaxes and if there is muscle symmetry. After, the patient will get dressed and PT and patient will discuss exam findings and plan of care. PT and patient discuss plan of care, schedule, attendance policy and HEP activities.  Therapist finger going into the vaginal canal to work on the sides of the introitus and urogenital diaphragm Neuromuscular re-education: Core retraining: Core facilitation: Form correction: Pelvic floor contraction training:sitting lean forward quickly contract the pelvic floor 5x 5 sets; therapist finger in the vaginal canal and taping the sides of the introitus to work on circular contraction Therapist finger in the vaginal canal and resisting the right hip adduction to improve the right lateral contraction of the introitus Down training:Educated patient on the urge to  void and how to work with it and contract the pelvic floor.  Exercises: Strengthening:dead lift with 10# in each hand vc to keep distance between rib cage and pelvic bone with pelvic floor contraction Therapeutic activities: Functional strengthening activities:educated patient to cough and contract the pelvic floor to reduce urinary leakage.      06/21/2022 Neuromuscular re-education: Pelvic floor contraction training:supine marching holding yoga block at shoulder height and pelvic  floor contraction.  Supine bring hand to opposite knee with pelvic floor contraction Plank on knees with engagement of pelvic floor and abdominals Side plank with engagement of abdominals and pelvic floor.  Bridge with core activation and pelvic floor then contract one gluteal at a time     Self-care: Educated patient on lubricants and vaginal health to reduce pain with intercourse.         PATIENT EDUCATION:  07/05/2022 Education details: Access Code: [email or web address], education on urge to void Person educated: Patient Education method: Explanation, Demonstration, Tactile cues, Verbal cues, and Handouts Education comprehension: verbalized understanding, returned demonstration, verbal cues required, tactile cues required, and needs further education     HOME EXERCISE PROGRAM: 07/05/2022  Access Code: [email or web address] URL: [email or web address] Date: 07/05/2022 Prepared by: Earlie Counts  Exercises -- Standing Deadlift with Barbell - Knees Straight  - 1 x daily - 7 x weekly - 1 sets - 10 reps - Seated Quick Flick Pelvic Floor Contractions  - 4 x daily - 7 x weekly - 1 sets - 5 reps ASSESSMENT:   CLINICAL IMPRESSION: Patient is a 46 y.o. female who was seen today for physical therapy treatment for stress incontinence. Pelvic floor strength is 3/5. Patient was able to contract the pelvic floor and laugh. She has learned the urge to oid to reduce the need to go to the bathroom at the end of her walk.  Patient is more aware of contracting the pelvic floor with Barre exercise and daily activities. Patient will benefit from skilled therapy to improve pelvic floor strengthening and coordination to reduce her leakage.      OBJECTIVE IMPAIRMENTS decreased coordination, decreased endurance, decreased strength, and increased fascial restrictions.    ACTIVITY LIMITATIONS continence   PARTICIPATION LIMITATIONS:  exercise   PERSONAL FACTORS  none  are also affecting patient's functional outcome.     REHAB POTENTIAL: Excellent   CLINICAL DECISION MAKING: Stable/uncomplicated   EVALUATION COMPLEXITY: Low     GOALS: Goals reviewed with patient? Yes   SHORT TERM GOALS: Target date: 06/28/2022   Patient independent with initial HEP for pelvic floor strengthening to reduce leakage.  Baseline: Goal status: Met 07/05/2022     LONG TERM GOALS: Target date: 08/23/2022    Patient independent with advanced HEP for core and pelvic floor strength to reduce leakage.  Baseline:  Goal status: INITIAL   2.  Patient is able to recruit her lower abdominals with exercise to reduce the pressure on the pelvic floor causing leakage.  Baseline:  Goal status: INITIAL   3.  Patient is able to cough, laugh, and sneeze without leakage due to the ability to contract and lift the pelvic floor.  Baseline:  Goal status: ongoing 07/05/2022   4.  Patient is able to walk to the bathroom without urinary leakage due to improved coordination.  Baseline:  Goal status: ongoing 07/05/2022     PLAN: PT FREQUENCY: 1x/week   PT DURATION: 12 weeks   PLANNED INTERVENTIONS: Therapeutic exercises, Therapeutic activity, Neuromuscular re-education, Patient/Family education, Self Care, Joint mobilization, Biofeedback, and Manual  therapy   PLAN FOR NEXT SESSION:  pelvic floor strength with exercises, quick flicks, manual work to vagina to work on Product/process development scientist of pelvic floor Earlie Counts, PT 07/05/22 10:57 AM

## 2022-07-05 NOTE — Patient Instructions (Signed)
Urge Incontinence  Ideal urination frequency is every 2-4 wakeful hours, which equates to 5-8 times within a 24-hour period.   Urge incontinence is leakage that occurs when the bladder muscle contracts, creating a sudden need to go before getting to the bathroom.   Going too often when your bladder isn't actually full can disrupt the body's automatic signals to store and hold urine longer, which will increase urgency/frequency.  In this case, the bladder "is running the show" and strategies can be learned to retrain this pattern.   One should be able to control the first urge to urinate, at around 147mL.  The bladder can hold up to a "grande latte," or 494mL. To help you gain control, practice the Urge Drill below when urgency strikes.  This drill will help retrain your bladder signals and allow you to store and hold urine longer.  The overall goal is to stretch out your time between voids to reach a more manageable voiding schedule.    Practice your "quick flicks" often throughout the day (each waking hour) even when you don't need feel the urge to go.  This will help strengthen your pelvic floor muscles, making them more effective in controlling leakage.  Urge Drill  When you feel an urge to go, follow these steps to regain control: Stop what you are doing and be still Take one deep breath, directing your air into your abdomen Think an affirming thought, such as "I've got this." Do 5 quick flicks of your pelvic floor Heel raises with landing hard on the heels. Walk with control to the bathroom to void, or delay voiding If urge comes back then repeat as above.   [facility] [address], [place], [place] [address] Phone # [phone] Fax [phone]

## 2022-07-19 ENCOUNTER — Encounter: Payer: Self-pay | Admitting: Physical Therapy

## 2022-07-19 ENCOUNTER — Ambulatory Visit: Payer: 59 | Admitting: Physical Therapy

## 2022-07-19 DIAGNOSIS — M6281 Muscle weakness (generalized): Secondary | ICD-10-CM

## 2022-07-19 DIAGNOSIS — R278 Other lack of coordination: Secondary | ICD-10-CM

## 2022-07-19 NOTE — Therapy (Signed)
OUTPATIENT PHYSICAL THERAPY TREATMENT NOTE   Patient Name: Christie Patel MRN: [id number] DOB:22-Jan-1976, 46 y.o., female Today's Date: 07/19/2022  PCP: None REFERRING PROVIDER: Marda Stalker, PA-C  END OF SESSION:   PT End of Session - 07/19/22 1142     Visit Number 4    Date for PT Re-Evaluation 08/23/22    Authorization Type [employer]    PT Start Time 2952    PT Stop Time 1225    PT Time Calculation (min) 40 min    Activity Tolerance Patient tolerated treatment well    Behavior During Therapy [facility] for tasks assessed/performed             History reviewed. No pertinent past medical history. Past Surgical History:  Procedure Laterality Date   BREAST SURGERY     SHOULDER SURGERY Bilateral    rotator cuff and biceps   There are no problems to display for this patient.  REFERRING DIAG: N39.3 (ICD-10-CM) - Stress incontinence (female) (female)   THERAPY DIAG:  No diagnosis found.   Rationale for Evaluation and Treatment Rehabilitation   ONSET DATE: 2008   SUBJECTIVE:                                                                                                                                                                                            SUBJECTIVE STATEMENT: I can hold in my abdominal and pelvic floor muscles with I exercise. Walking to the bathroom without leakage is 45% better. Still leak with coughing and sneezing. Leakage with exercise is 50% better.    PAIN:  Are you having pain? yes NPRS scale: 4/10 Pain location: Vaginal   Pain type: discomfort Pain description: intermittent    Aggravating factors: during intercourse Relieving factors: no vaginal penetration   PRECAUTIONS: None   WEIGHT BEARING RESTRICTIONS No   FALLS:  Has patient fallen in last 6 months? No   LIVING ENVIRONMENT: Lives with: lives with their family   OCCUPATION: sitting job; walks 2 miles per day, yoga, [employer]   PLOF: Independent   PATIENT GOALS reduce  leakage   PERTINENT HISTORY:  none   BOWEL MOVEMENT Pain with bowel movement: No   URINATION Pain with urination: No Fully empty bladder: Yes: can urinate in one hour without having liquid prior Stream:  average Urgency: Yes: sometimes Frequency: in the morning will go more often but later in the day can wait longer.  Leakage: Urge to void, Walking to the bathroom, Coughing, Sneezing, and Exercise Pads: No   INTERCOURSE Pain with intercourse: During Penetration Ability to have vaginal penetration:  Yes:   Climax: yes Marinoff Scale:  1/3   PREGNANCY Vaginal deliveries 1 Tearing Yes: tear   PROLAPSE None       OBJECTIVE:    COGNITION:            Overall cognitive status: Within functional limits for tasks assessed                          SENSATION:            Light touch: Appears intact            Proprioception: Appears intact                    POSTURE: No Significant postural limitations               PELVIC ALIGNMENT:   LUMBARAROM/PROM lumbar ROM is full     LOWER EXTREMITY ROM: bilateral hip ROM is full     LOWER EXTREMITY MMT:   MMT Right eval Left eval  Hip abduction 4/5 4/5     PALPATION:   General  contract abdomen and will slightly bulge the lower abdomen.                  External Perineal Exam intact                             Internal Pelvic Floor sides of introitus is tight. After manual work to the sides of the introitus the patient was able to do a circular contraction with a hug of the therapist finger holding 3-4 seconds. She has to contract slowly to form the lift of the pelvic floor.    Patient confirms identification and approves PT to assess internal pelvic floor and treatment Yes   PELVIC MMT:   MMT eval 07/05/2022  Vaginal 2/5 with oval contraction 3/5  (Blank rows = not tested)         TONE: good   PROLAPSE: None   TODAY'S TREATMENT  07/19/2022 Neuromuscular re-education: Pelvic floor contraction  training:bridge with marching Supine with hips at 90 degrees and yoga block between knees and arms at 90 degrees shoulder height moving in opposite directions Bird dog with bands 10x each leg Standing leg movement in barre with keeping hips leveled Standing side lunge going quickly to work the quick flicks Alternate shoulder and hip flexion going quickly to engage the quick flicks of the pelvic floor    07/05/2022 Manual: Internal pelvic floor techniques:No emotional/communication barriers or cognitive limitation. Patient is motivated to learn. Patient understands and agrees with treatment goals and plan. PT explains patient will be examined in standing, sitting, and lying down to see how their muscles and joints work. When they are ready, they will be asked to remove their underwear so PT can examine their perineum. The patient is also given the option of providing their own chaperone as one is not provided in our facility. The patient also has the right and is explained the right to defer or refuse any part of the evaluation or treatment including the internal exam. With the patient's consent, PT will use one gloved finger to gently assess the muscles of the pelvic floor, seeing how well it contracts and relaxes and if there is muscle symmetry. After, the patient will get dressed and PT and patient will discuss exam findings and plan of care. PT and patient discuss plan of care, schedule, attendance policy and HEP  activities.  Therapist finger going into the vaginal canal to work on the sides of the introitus and urogenital diaphragm Neuromuscular re-education: Pelvic floor contraction training:sitting lean forward quickly contract the pelvic floor 5x 5 sets; therapist finger in the vaginal canal and taping the sides of the introitus to work on circular contraction Therapist finger in the vaginal canal and resisting the right hip adduction to improve the right lateral contraction of the introitus Down  training:Educated patient on the urge to void and how to work with it and contract the pelvic floor.  Exercises: Strengthening:dead lift with 10# in each hand vc to keep distance between rib cage and pelvic bone with pelvic floor contraction Therapeutic activities: Functional strengthening activities:educated patient to cough and contract the pelvic floor to reduce urinary leakage.       06/21/2022 Neuromuscular re-education: Pelvic floor contraction training:supine marching holding yoga block at shoulder height and pelvic floor contraction.  Supine bring hand to opposite knee with pelvic floor contraction Plank on knees with engagement of pelvic floor and abdominals Side plank with engagement of abdominals and pelvic floor.  Bridge with core activation and pelvic floor then contract one gluteal at a time     Self-care: Educated patient on lubricants and vaginal health to reduce pain with intercourse.         PATIENT EDUCATION:  07/19/2022 Education details: Access Code: [email or web address] Person educated: Patient Education method: Explanation, Demonstration, Tactile cues, Verbal cues, and Handouts Education comprehension: verbalized understanding, returned demonstration, verbal cues required, tactile cues required, and needs further education     HOME EXERCISE PROGRAM: 07/19/2022  Access Code: [email or web address] URL: [email or web address] Date: 07/19/2022 Prepared by: Eulis Foster  Exercises -- Supine Dead Bug with Leg Extension  - 1 x daily - 3 x weekly - 1 sets - 10 reps - Supine Rotational Dead Bug with Pilates Ring  - 1 x daily - 3 x weekly - 1 sets - 10 reps - Marching Bridge  - 1 x daily - 3 x weekly - 1 sets - 10 reps - Side Stepping with Resistance at Ankles  - 1 x daily - 3 x weekly - 1 sets - 10 reps - Single Arm Lunge Row - Opposite Forward Leg  - 1 x daily - 3 x weekly - 1 sets - 10 reps - Upright Side Lunge  - 1 x daily - 3 x weekly - 10 reps  ASSESSMENT:    CLINICAL IMPRESSION: Patient is a 46 y.o. female who was seen today for physical therapy treatment for stress incontinence. Walking to the bathroom without leakage is 45% better. Still leak with coughing and sneezing. Leakage with exercise is 50% better.Patient will benefit from skilled therapy to improve pelvic floor strengthening and coordination to reduce her leakage.      OBJECTIVE IMPAIRMENTS decreased coordination, decreased endurance, decreased strength, and increased fascial restrictions.    ACTIVITY LIMITATIONS continence   PARTICIPATION LIMITATIONS:  exercise   PERSONAL FACTORS  none  are also affecting patient's functional outcome.    REHAB POTENTIAL: Excellent   CLINICAL DECISION MAKING: Stable/uncomplicated   EVALUATION COMPLEXITY: Low     GOALS: Goals reviewed with patient? Yes   SHORT TERM GOALS: Target date: 06/28/2022   Patient independent with initial HEP for pelvic floor strengthening to reduce leakage.  Baseline: Goal status: Met 07/05/2022     LONG TERM GOALS: Target date: 08/23/2022    Patient independent with advanced HEP for core and pelvic floor strength to reduce leakage.  Baseline:  Goal status: Ongoing 07/19/2022   2.  Patient is able to recruit her lower abdominals with exercise to reduce the pressure on the pelvic floor causing leakage.  Baseline:  Goal status: met 07/19/2022   3.  Patient is able to cough, laugh, and sneeze without leakage due to the ability to contract and lift the pelvic floor.  Baseline: 50% better Goal status: ongoing 07/05/2022   4.  Patient is able to walk to the bathroom without urinary leakage due to improved coordination.  Baseline: 45% better Goal status: ongoing 07/05/2022     PLAN: PT FREQUENCY: 1x/week   PT DURATION: 12 weeks   PLANNED INTERVENTIONS: Therapeutic exercises, Therapeutic activity, Neuromuscular re-education, Patient/Family education, Self Care, Joint mobilization, Biofeedback, and Manual  therapy   PLAN FOR NEXT SESSION:  pelvic floor strength with exercises, quick flicks, see how the urge to void is going  Earlie Counts, PT 07/19/22 12:24 PM

## 2022-08-02 ENCOUNTER — Ambulatory Visit: Payer: 59 | Attending: Family Medicine | Admitting: Physical Therapy

## 2022-08-02 ENCOUNTER — Encounter: Payer: Self-pay | Admitting: Physical Therapy

## 2022-08-02 DIAGNOSIS — R278 Other lack of coordination: Secondary | ICD-10-CM | POA: Diagnosis present

## 2022-08-02 DIAGNOSIS — M6281 Muscle weakness (generalized): Secondary | ICD-10-CM | POA: Insufficient documentation

## 2022-08-02 NOTE — Therapy (Signed)
OUTPATIENT PHYSICAL THERAPY TREATMENT NOTE   Patient Name: Christie Patel MRN: [id number] DOB:[birth year], 46 y.o., female Today's Date: 08/02/2022  PCP: None REFERRING PROVIDER: Marda Stalker, PA-C  END OF SESSION:   PT End of Session - 08/02/22 0931     Visit Number 5    Date for PT Re-Evaluation 08/23/22    Authorization Type [employer]    PT Start Time 0930    PT Stop Time 1010    PT Time Calculation (min) 40 min    Activity Tolerance Patient tolerated treatment well    Behavior During Therapy [facility] for tasks assessed/performed             History reviewed. No pertinent past medical history. Past Surgical History:  Procedure Laterality Date   BREAST SURGERY     SHOULDER SURGERY Bilateral    rotator cuff and biceps   There are no problems to display for this patient.  REFERRING DIAG: N39.3 (ICD-10-CM) - Stress incontinence (female) (female)   THERAPY DIAG:  No diagnosis found.   Rationale for Evaluation and Treatment Rehabilitation   ONSET DATE: 2008   SUBJECTIVE:                                                                                                                                                                                            SUBJECTIVE STATEMENT: I notice there are times when I am able to hold my urine better. No pain with intercourse in the last month. Delaying the urge to void but has to really think about it. Patient leaked urine 1 time since last visit with cough or sneeze.    PAIN:  Are you having pain? no NPRS scale: 0/10 Pain location: Vaginal   Pain type: discomfort Pain description: intermittent    Aggravating factors: during intercourse Relieving factors: no vaginal penetration   PRECAUTIONS: None   WEIGHT BEARING RESTRICTIONS No   FALLS:  Has patient fallen in last 6 months? No   LIVING ENVIRONMENT: Lives with: lives with their family   OCCUPATION: sitting job; walks 2 miles per day, yoga, [employer]   PLOF:  Independent   PATIENT GOALS reduce leakage   PERTINENT HISTORY:  none   BOWEL MOVEMENT Pain with bowel movement: No   URINATION Pain with urination: No Fully empty bladder: Yes: can urinate in one hour without having liquid prior Stream:  average Urgency: Yes: sometimes Frequency: in the morning will go more often but later in the day can wait longer.  Leakage: Urge to void, Walking to the bathroom, Coughing, Sneezing, and Exercise Pads: No   INTERCOURSE Pain with intercourse: During Penetration Ability  to have vaginal penetration:  Yes:   Climax: yes Marinoff Scale: 1/3   PREGNANCY Vaginal deliveries 1 Tearing Yes: tear   PROLAPSE None       OBJECTIVE:    COGNITION:            Overall cognitive status: Within functional limits for tasks assessed                          SENSATION:            Light touch: Appears intact            Proprioception: Appears intact                    POSTURE: No Significant postural limitations               PELVIC ALIGNMENT:   LUMBARAROM/PROM lumbar ROM is full     LOWER EXTREMITY ROM: bilateral hip ROM is full     LOWER EXTREMITY MMT:   MMT Right eval Left eval Right  08/02/2022 Left 08/02/2022  Hip abduction 4/5 4/5 5/5 5/5     PALPATION:   General  contract abdomen and will slightly bulge the lower abdomen.                  External Perineal Exam intact                             Internal Pelvic Floor sides of introitus is tight. After manual work to the sides of the introitus the patient was able to do a circular contraction with a hug of the therapist finger holding 3-4 seconds. She has to contract slowly to form the lift of the pelvic floor.    Patient confirms identification and approves PT to assess internal pelvic floor and treatment Yes   PELVIC MMT:   MMT eval 07/05/2022 08/02/2022  Vaginal 2/5 with oval contraction 3/5 4/5 in supine and standing 2 sec  (Blank rows = not tested)          TONE: good   PROLAPSE: None   TODAY'S TREATMENT  08/02/2022 Manual: Internal pelvic floor techniques:No emotional/communication barriers or cognitive limitation. Patient is motivated to learn. Patient understands and agrees with treatment goals and plan. PT explains patient will be examined in standing, sitting, and lying down to see how their muscles and joints work. When they are ready, they will be asked to remove their underwear so PT can examine their perineum. The patient is also given the option of providing their own chaperone as one is not provided in our facility. The patient also has the right and is explained the right to defer or refuse any part of the evaluation or treatment including the internal exam. With the patient's consent, PT will use one gloved finger to gently assess the muscles of the pelvic floor, seeing how well it contracts and relaxes and if there is muscle symmetry. After, the patient will get dressed and PT and patient will discuss exam findings and plan of care. PT and patient discuss plan of care, schedule, attendance policy and HEP activities.  Placed index finger into the vaginal canal assessing strength in supine and standing Index finger in the vaginal canal in supine with marching and tactile cues to engage the pelvic floor Index finger in the vaginal canal in standing giving tactile cues to contract pelvic floor in  squatting and marching without moving trunk Neuromuscular re-education: Pelvic floor contraction training:bridge with marching Supine with hips at 90 degrees and yoga block between knees and arms at 90 degrees shoulder height moving in opposite directions Bird dog with bands 10x each leg Standing lunge with band around knees  Standing side lunge going quickly to work the quick flicks Alternate shoulder and hip flexion going quickly to engage the quick flicks of the pelvic floor    07/19/2022 Neuromuscular re-education: Pelvic floor contraction  training:bridge with marching Supine with hips at 90 degrees and yoga block between knees and arms at 90 degrees shoulder height moving in opposite directions Bird dog with bands 10x each leg Standing leg movement in barre with keeping hips leveled Standing side lunge going quickly to work the quick flicks Alternate shoulder and hip flexion going quickly to engage the quick flicks of the pelvic floor    07/05/2022 Manual: Internal pelvic floor techniques:No emotional/communication barriers or cognitive limitation. Patient is motivated to learn. Patient understands and agrees with treatment goals and plan. PT explains patient will be examined in standing, sitting, and lying down to see how their muscles and joints work. When they are ready, they will be asked to remove their underwear so PT can examine their perineum. The patient is also given the option of providing their own chaperone as one is not provided in our facility. The patient also has the right and is explained the right to defer or refuse any part of the evaluation or treatment including the internal exam. With the patient's consent, PT will use one gloved finger to gently assess the muscles of the pelvic floor, seeing how well it contracts and relaxes and if there is muscle symmetry. After, the patient will get dressed and PT and patient will discuss exam findings and plan of care. PT and patient discuss plan of care, schedule, attendance policy and HEP activities.  Therapist finger going into the vaginal canal to work on the sides of the introitus and urogenital diaphragm Neuromuscular re-education: Pelvic floor contraction training:sitting lean forward quickly contract the pelvic floor 5x 5 sets; therapist finger in the vaginal canal and taping the sides of the introitus to work on circular contraction Therapist finger in the vaginal canal and resisting the right hip adduction to improve the right lateral contraction of the introitus Down  training:Educated patient on the urge to void and how to work with it and contract the pelvic floor.  Exercises: Strengthening:dead lift with 10# in each hand vc to keep distance between rib cage and pelvic bone with pelvic floor contraction Therapeutic activities: Functional strengthening activities:educated patient to cough and contract the pelvic floor to reduce urinary leakage.       PATIENT EDUCATION:  08/02/2022 Education details: Access Code: [email or web address] Person educated: Patient Education method: Explanation, Demonstration, Tactile cues, Verbal cues, and Handouts Education comprehension: verbalized understanding, returned demonstration, verbal cues required, tactile cues required, and needs further education     HOME EXERCISE PROGRAM: 08/02/17/2023   Access Code: [email or web address] URL: [email or web address] Date: 08/02/2022 Prepared by: Earlie Counts  Exercises - Seated Pelvic Floor Contraction  - 4 x daily - 7 x weekly - 1 sets - 5 reps - 4-5 sec hold - Plank on Knees  - 1 x daily - 7 x weekly - 3 sets - 10 reps - Side Plank on Knees  - 1 x daily - 7 x weekly - 3 sets - 10 reps - Standing Deadlift with Barbell - Knees Straight  -  1 x daily - 7 x weekly - 1 sets - 10 reps - Seated Quick Flick Pelvic Floor Contractions  - 4 x daily - 7 x weekly - 1 sets - 5 reps - Supine Dead Bug with Leg Extension  - 1 x daily - 3 x weekly - 1 sets - 10 reps - Supine Rotational Dead Bug with Pilates Ring  - 1 x daily - 3 x weekly - 1 sets - 10 reps - Marching Bridge  - 1 x daily - 3 x weekly - 1 sets - 10 reps - Side Stepping with Resistance at Ankles  - 1 x daily - 3 x weekly - 1 sets - 10 reps - Single Arm Lunge Row - Opposite Forward Leg  - 1 x daily - 3 x weekly - 1 sets - 10 reps - Upright Side Lunge  - 1 x daily - 3 x weekly - 10 reps ASSESSMENT:   CLINICAL IMPRESSION: Patient is a 46 y.o. female who was seen today for physical therapy treatment for stress incontinence. Increased hip  abduction strength. Pelvic floor strength is 4/5 holding for 2 seconds in supine and standing. She is able to contract the pelvic floor with exercise but needed tactile cues to maintain the contraction. Patient does not always contract her pelvic floor with laughing and needed verbal cues. She was able to dely the urge to void while walking. She has only leaked urine 1 time since last visit. Patient will benefit from skilled therapy to improve pelvic floor strengthening and coordination to reduce her leakage.      OBJECTIVE IMPAIRMENTS decreased coordination, decreased endurance, decreased strength, and increased fascial restrictions.    ACTIVITY LIMITATIONS continence   PARTICIPATION LIMITATIONS:  exercise   PERSONAL FACTORS  none  are also affecting patient's functional outcome.    REHAB POTENTIAL: Excellent   CLINICAL DECISION MAKING: Stable/uncomplicated   EVALUATION COMPLEXITY: Low     GOALS: Goals reviewed with patient? Yes   SHORT TERM GOALS: Target date: 06/28/2022   Patient independent with initial HEP for pelvic floor strengthening to reduce leakage.  Baseline: Goal status: Met 07/05/2022     LONG TERM GOALS: Target date: 08/23/2022    Patient independent with advanced HEP for core and pelvic floor strength to reduce leakage.  Baseline:  Goal status: Ongoing 07/19/2022   2.  Patient is able to recruit her lower abdominals with exercise to reduce the pressure on the pelvic floor causing leakage.  Baseline:  Goal status: met 07/19/2022   3.  Patient is able to cough, laugh, and sneeze without leakage due to the ability to contract and lift the pelvic floor.  Baseline: 50% better Goal status: ongoing 07/05/2022   4.  Patient is able to walk to the bathroom without urinary leakage due to improved coordination.  Baseline: 45% better Goal status: ongoing 07/05/2022     PLAN: PT FREQUENCY: 1x/week   PT DURATION: 12 weeks   PLANNED INTERVENTIONS: Therapeutic  exercises, Therapeutic activity, Neuromuscular re-education, Patient/Family education, Self Care, Joint mobilization, Biofeedback, and Manual therapy   PLAN FOR NEXT SESSION:  pelvic floor strength with exercises, quick flicks  Earlie Counts, PT 08/02/22 10:14 AM

## 2022-08-14 ENCOUNTER — Ambulatory Visit: Payer: 59 | Admitting: Physical Therapy

## 2022-08-14 ENCOUNTER — Encounter: Payer: Self-pay | Admitting: Physical Therapy

## 2022-08-14 DIAGNOSIS — M6281 Muscle weakness (generalized): Secondary | ICD-10-CM

## 2022-08-14 DIAGNOSIS — R278 Other lack of coordination: Secondary | ICD-10-CM

## 2022-08-14 NOTE — Therapy (Signed)
OUTPATIENT PHYSICAL THERAPY TREATMENT NOTE   Patient Name: Christie Patel MRN: [id number] DOB:1975/12/11, 46 y.o., female Today's Date: 08/14/2022  PCP: None  REFERRING PROVIDER: Marda Stalker, PA-C  None  END OF SESSION:   PT End of Session - 08/14/22 1228     Visit Number 7    Date for PT Re-Evaluation 08/23/22    Authorization Type [employer]    PT Start Time 1230    PT Stop Time 1255    PT Time Calculation (min) 25 min    Activity Tolerance Patient tolerated treatment well    Behavior During Therapy [facility] for tasks assessed/performed             History reviewed. No pertinent past medical history. Past Surgical History:  Procedure Laterality Date   BREAST SURGERY     SHOULDER SURGERY Bilateral    rotator cuff and biceps   There are no problems to display for this patient.  REFERRING DIAG: N39.3 (ICD-10-CM) - Stress incontinence (female) (female)   THERAPY DIAG:  No diagnosis found.   Rationale for Evaluation and Treatment Rehabilitation   ONSET DATE: 2008   SUBJECTIVE:                                                                                                                                                                                            SUBJECTIVE STATEMENT: Everything going well. No pain with intercourse.     PAIN:  Are you having pain? no NPRS scale: 0/10 Pain location: Vaginal   Pain type: discomfort Pain description: intermittent    Aggravating factors: during intercourse Relieving factors: no vaginal penetration   PRECAUTIONS: None   WEIGHT BEARING RESTRICTIONS No   FALLS:  Has patient fallen in last 6 months? No   LIVING ENVIRONMENT: Lives with: lives with their family   OCCUPATION: sitting job; walks 2 miles per day, yoga, [employer]   PLOF: Independent   PATIENT GOALS reduce leakage   PERTINENT HISTORY:  none   BOWEL MOVEMENT Pain with bowel movement: No   URINATION Pain with urination: No Fully empty bladder:  Yes: can urinate in one hour without having liquid prior Stream:  average Urgency: less due to public bathroom Frequency: in the morning will go more often but later in the day can wait longer.  Leakage:  Coughing Pads: No   INTERCOURSE Pain with intercourse: no Ability to have vaginal penetration:  Yes:   Climax: yes Marinoff Scale: 0/3   PREGNANCY Vaginal deliveries 1 Tearing Yes: tear   PROLAPSE None     OBJECTIVE: (objective measures completed at initial evaluation unless otherwise dated)   (  Copy Eval's Objective through Plan section here)     COGNITION:            Overall cognitive status: Within functional limits for tasks assessed                          SENSATION:            Light touch: Appears intact            Proprioception: Appears intact                    POSTURE: No Significant postural limitations               PELVIC ALIGNMENT:   LUMBARAROM/PROM lumbar ROM is full     LOWER EXTREMITY ROM: bilateral hip ROM is full     LOWER EXTREMITY MMT:   MMT Right eval Left eval Right  08/02/2022 Left 08/02/2022  Hip abduction 4/5 4/5 5/5 5/5     PALPATION:   General  contract abdomen and will slightly bulge the lower abdomen.                  External Perineal Exam intact                             Internal Pelvic Floor sides of introitus is tight. After manual work to the sides of the introitus the patient was able to do a circular contraction with a hug of the therapist finger holding 3-4 seconds. She has to contract slowly to form the lift of the pelvic floor.    Patient confirms identification and approves PT to assess internal pelvic floor and treatment Yes   PELVIC MMT:   MMT eval 07/05/2022 08/02/2022  Vaginal 2/5 with oval contraction 3/5 4/5 in supine and standing 2 sec  (Blank rows = not tested)         TONE: good   PROLAPSE: None   TODAY'S TREATMENT  08/14/2022 Exercises: Stretches/mobility:bilateral shoulder extension with  lifting arm from back Strengthening:squat with keeping distance between the rib cage and pubic bone and increase curve of lumbar and pelvic floor contraction  Dead lift with 8# wt Biceps with slight squat and engage the core with 8# 1/2 squat on step holding 8# with core  Therapeutic activities: Functional strengthening activities:educated patient on how to keep the rib cage and pubic bone distance to not put pressure on the bladder.  Educated patient posture with working out and keeping shoulder blades retracted to work the back.     08/02/2022 Manual: Internal pelvic floor techniques:No emotional/communication barriers or cognitive limitation. Patient is motivated to learn. Patient understands and agrees with treatment goals and plan. PT explains patient will be examined in standing, sitting, and lying down to see how their muscles and joints work. When they are ready, they will be asked to remove their underwear so PT can examine their perineum. The patient is also given the option of providing their own chaperone as one is not provided in our facility. The patient also has the right and is explained the right to defer or refuse any part of the evaluation or treatment including the internal exam. With the patient's consent, PT will use one gloved finger to gently assess the muscles of the pelvic floor, seeing how well it contracts and relaxes and if there is muscle symmetry. After, the patient will get  dressed and PT and patient will discuss exam findings and plan of care. PT and patient discuss plan of care, schedule, attendance policy and HEP activities.  Placed index finger into the vaginal canal assessing strength in supine and standing Index finger in the vaginal canal in supine with marching and tactile cues to engage the pelvic floor Index finger in the vaginal canal in standing giving tactile cues to contract pelvic floor in squatting and marching without moving trunk Neuromuscular  re-education: Pelvic floor contraction training:bridge with marching Supine with hips at 90 degrees and yoga block between knees and arms at 90 degrees shoulder height moving in opposite directions Bird dog with bands 10x each leg Standing lunge with band around knees  Standing side lunge going quickly to work the quick flicks Alternate shoulder and hip flexion going quickly to engage the quick flicks of the pelvic floor    07/19/2022 Neuromuscular re-education: Pelvic floor contraction training:bridge with marching Supine with hips at 90 degrees and yoga block between knees and arms at 90 degrees shoulder height moving in opposite directions Bird dog with bands 10x each leg Standing leg movement in barre with keeping hips leveled Standing side lunge going quickly to work the quick flicks Alternate shoulder and hip flexion going quickly to engage the quick flicks of the pelvic floor        PATIENT EDUCATION:  08/02/2022 Education details: Access Code: [email or web address] Person educated: Patient Education method: Explanation, Demonstration, Tactile cues, Verbal cues, and Handouts Education comprehension: verbalized understanding, returned demonstration, verbal cues required, tactile cues required, and needs further education     HOME EXERCISE PROGRAM: 08/02/17/2023   Access Code: [email or web address] URL: [email or web address] Date: 08/02/2022 Prepared by: Earlie Counts   Exercises - Seated Pelvic Floor Contraction  - 4 x daily - 7 x weekly - 1 sets - 5 reps - 4-5 sec hold - Plank on Knees  - 1 x daily - 7 x weekly - 3 sets - 10 reps - Side Plank on Knees  - 1 x daily - 7 x weekly - 3 sets - 10 reps - Standing Deadlift with Barbell - Knees Straight  - 1 x daily - 7 x weekly - 1 sets - 10 reps - Seated Quick Flick Pelvic Floor Contractions  - 4 x daily - 7 x weekly - 1 sets - 5 reps - Supine Dead Bug with Leg Extension  - 1 x daily - 3 x weekly - 1 sets - 10 reps - Supine Rotational Dead  Bug with Pilates Ring  - 1 x daily - 3 x weekly - 1 sets - 10 reps - Marching Bridge  - 1 x daily - 3 x weekly - 1 sets - 10 reps - Side Stepping with Resistance at Ankles  - 1 x daily - 3 x weekly - 1 sets - 10 reps - Single Arm Lunge Row - Opposite Forward Leg  - 1 x daily - 3 x weekly - 1 sets - 10 reps - Upright Side Lunge  - 1 x daily - 3 x weekly - 10 reps ASSESSMENT:   CLINICAL IMPRESSION: Patient is a 46 y.o. female who was seen today for physical therapy treatment for stress incontinence. Increased hip abduction strength. Pelvic floor strength is 4/5 holding for 2 seconds in supine and standing.  She was able to dely the urge to void while walking. She has only leaked urine 1 time since last visit. Patient is independent with her HEP  OBJECTIVE IMPAIRMENTS decreased coordination, decreased endurance, decreased strength, and increased fascial restrictions.    ACTIVITY LIMITATIONS continence   PARTICIPATION LIMITATIONS:  exercise   PERSONAL FACTORS  none  are also affecting patient's functional outcome.    REHAB POTENTIAL: Excellent   CLINICAL DECISION MAKING: Stable/uncomplicated   EVALUATION COMPLEXITY: Low     GOALS: Goals reviewed with patient? Yes   SHORT TERM GOALS: Target date: 06/28/2022   Patient independent with initial HEP for pelvic floor strengthening to reduce leakage.  Baseline: Goal status: Met 07/05/2022     LONG TERM GOALS: Target date: 08/23/2022    Patient independent with advanced HEP for core and pelvic floor strength to reduce leakage.  Baseline:  Goal status: Ongoing 07/19/2022   2.  Patient is able to recruit her lower abdominals with exercise to reduce the pressure on the pelvic floor causing leakage.  Baseline:  Goal status: met 07/19/2022   3.  Patient is able to cough, laugh, and sneeze without leakage due to the ability to contract and lift the pelvic floor.  Baseline: only trouble with coughing Goal status: partially met 07/05/2022    4.  Patient is able to walk to the bathroom without urinary leakage due to improved coordination.  Baseline:  Goal status: Met 08/14/2022    PLAN: PLAN FOR NEXT SESSION: Discharge to HEP   Earlie Counts, PT 08/14/22 1:03 PM   PHYSICAL THERAPY DISCHARGE SUMMARY  Visits from Start of Care: 7  Current functional level related to goals / functional outcomes: See above.    Remaining deficits: See above.    Education / Equipment: HEP   Patient agrees to discharge. Patient goals were met. Patient is being discharged due to meeting the stated rehab goals. Thank you for the referral. Earlie Counts, PT 08/14/22 1:03 PM

## 2022-08-16 ENCOUNTER — Encounter: Payer: 59 | Admitting: Physical Therapy

## 2023-01-18 ENCOUNTER — Other Ambulatory Visit: Payer: Self-pay | Admitting: Obstetrics and Gynecology

## 2023-01-18 DIAGNOSIS — R928 Other abnormal and inconclusive findings on diagnostic imaging of breast: Secondary | ICD-10-CM

## 2023-01-25 ENCOUNTER — Ambulatory Visit
Admission: RE | Admit: 2023-01-25 | Discharge: 2023-01-25 | Disposition: A | Discharge status: Home / Self Care | Payer: 59 | Source: Ambulatory Visit | Attending: Obstetrics and Gynecology | Admitting: Obstetrics and Gynecology

## 2023-01-25 DIAGNOSIS — R928 Other abnormal and inconclusive findings on diagnostic imaging of breast: Secondary | ICD-10-CM
